# Patient Record
Sex: Female | Born: 1955
Health system: Southern US, Community
[De-identification: ages and names within clinical notes are randomized; demographics above are authoritative.]

## PROBLEM LIST (undated history)

## (undated) DIAGNOSIS — M797 Fibromyalgia: Secondary | ICD-10-CM

## (undated) DIAGNOSIS — T753XXA Motion sickness, initial encounter: Secondary | ICD-10-CM

## (undated) DIAGNOSIS — I6529 Occlusion and stenosis of unspecified carotid artery: Secondary | ICD-10-CM

## (undated) DIAGNOSIS — E039 Hypothyroidism, unspecified: Secondary | ICD-10-CM

## (undated) DIAGNOSIS — G459 Transient cerebral ischemic attack, unspecified: Secondary | ICD-10-CM

## (undated) DIAGNOSIS — C801 Malignant (primary) neoplasm, unspecified: Secondary | ICD-10-CM

## (undated) DIAGNOSIS — G43909 Migraine, unspecified, not intractable, without status migrainosus: Secondary | ICD-10-CM

## (undated) DIAGNOSIS — T8859XA Other complications of anesthesia, initial encounter: Secondary | ICD-10-CM

## (undated) HISTORY — PX: EYE SURGERY: SHX253

## (undated) HISTORY — PX: ABDOMINAL HYSTERECTOMY: SHX81

## (undated) HISTORY — PX: TONSILLECTOMY: SUR1361

## (undated) HISTORY — PX: OTHER SURGICAL HISTORY: SHX169

---

## 1991-07-03 HISTORY — PX: BREAST SURGERY: SHX581

## 1993-07-02 HISTORY — PX: BREAST BIOPSY: SHX20

## 2013-01-05 DIAGNOSIS — G43009 Migraine without aura, not intractable, without status migrainosus: Secondary | ICD-10-CM | POA: Insufficient documentation

## 2013-01-05 DIAGNOSIS — M797 Fibromyalgia: Secondary | ICD-10-CM | POA: Insufficient documentation

## 2013-01-05 DIAGNOSIS — I6522 Occlusion and stenosis of left carotid artery: Secondary | ICD-10-CM | POA: Insufficient documentation

## 2015-01-17 DIAGNOSIS — F419 Anxiety disorder, unspecified: Secondary | ICD-10-CM | POA: Insufficient documentation

## 2015-06-23 DIAGNOSIS — M810 Age-related osteoporosis without current pathological fracture: Secondary | ICD-10-CM | POA: Insufficient documentation

## 2017-02-21 LAB — HM COLONOSCOPY

## 2017-05-06 LAB — HM HIV SCREENING LAB: HM HIV Screening: NEGATIVE

## 2017-08-15 DIAGNOSIS — M1990 Unspecified osteoarthritis, unspecified site: Secondary | ICD-10-CM | POA: Insufficient documentation

## 2018-03-27 DIAGNOSIS — G43409 Hemiplegic migraine, not intractable, without status migrainosus: Secondary | ICD-10-CM | POA: Insufficient documentation

## 2018-03-28 DIAGNOSIS — E7800 Pure hypercholesterolemia, unspecified: Secondary | ICD-10-CM | POA: Insufficient documentation

## 2019-05-27 DIAGNOSIS — R748 Abnormal levels of other serum enzymes: Secondary | ICD-10-CM | POA: Insufficient documentation

## 2019-06-02 LAB — HM HEPATITIS C SCREENING LAB: HM Hepatitis Screen: NEGATIVE

## 2019-06-02 LAB — HM HIV SCREENING LAB: HM HIV Screening: NEGATIVE

## 2019-08-24 DIAGNOSIS — M542 Cervicalgia: Secondary | ICD-10-CM | POA: Diagnosis not present

## 2019-08-24 DIAGNOSIS — M6281 Muscle weakness (generalized): Secondary | ICD-10-CM | POA: Diagnosis not present

## 2019-08-24 DIAGNOSIS — M797 Fibromyalgia: Secondary | ICD-10-CM | POA: Diagnosis not present

## 2019-08-24 DIAGNOSIS — M25562 Pain in left knee: Secondary | ICD-10-CM | POA: Diagnosis not present

## 2019-08-24 DIAGNOSIS — R293 Abnormal posture: Secondary | ICD-10-CM | POA: Diagnosis not present

## 2019-08-24 DIAGNOSIS — M25561 Pain in right knee: Secondary | ICD-10-CM | POA: Diagnosis not present

## 2019-08-24 DIAGNOSIS — F17200 Nicotine dependence, unspecified, uncomplicated: Secondary | ICD-10-CM | POA: Diagnosis not present

## 2019-08-24 DIAGNOSIS — M79651 Pain in right thigh: Secondary | ICD-10-CM | POA: Diagnosis not present

## 2019-08-24 DIAGNOSIS — M545 Low back pain: Secondary | ICD-10-CM | POA: Diagnosis not present

## 2019-08-24 DIAGNOSIS — M79605 Pain in left leg: Secondary | ICD-10-CM | POA: Diagnosis not present

## 2019-08-24 DIAGNOSIS — S32009A Unspecified fracture of unspecified lumbar vertebra, initial encounter for closed fracture: Secondary | ICD-10-CM | POA: Diagnosis not present

## 2019-08-24 DIAGNOSIS — M546 Pain in thoracic spine: Secondary | ICD-10-CM | POA: Diagnosis not present

## 2019-08-31 DIAGNOSIS — R748 Abnormal levels of other serum enzymes: Secondary | ICD-10-CM | POA: Diagnosis not present

## 2019-08-31 DIAGNOSIS — Z6825 Body mass index (BMI) 25.0-25.9, adult: Secondary | ICD-10-CM | POA: Diagnosis not present

## 2019-09-21 DIAGNOSIS — K7689 Other specified diseases of liver: Secondary | ICD-10-CM | POA: Diagnosis not present

## 2019-09-21 DIAGNOSIS — R748 Abnormal levels of other serum enzymes: Secondary | ICD-10-CM | POA: Diagnosis not present

## 2019-09-21 DIAGNOSIS — R945 Abnormal results of liver function studies: Secondary | ICD-10-CM | POA: Diagnosis not present

## 2019-10-12 DIAGNOSIS — F17200 Nicotine dependence, unspecified, uncomplicated: Secondary | ICD-10-CM | POA: Diagnosis not present

## 2019-10-12 DIAGNOSIS — M545 Low back pain: Secondary | ICD-10-CM | POA: Diagnosis not present

## 2019-10-12 DIAGNOSIS — M25561 Pain in right knee: Secondary | ICD-10-CM | POA: Diagnosis not present

## 2019-10-12 DIAGNOSIS — M6281 Muscle weakness (generalized): Secondary | ICD-10-CM | POA: Diagnosis not present

## 2019-10-12 DIAGNOSIS — M79605 Pain in left leg: Secondary | ICD-10-CM | POA: Diagnosis not present

## 2019-10-12 DIAGNOSIS — M542 Cervicalgia: Secondary | ICD-10-CM | POA: Diagnosis not present

## 2019-10-12 DIAGNOSIS — R293 Abnormal posture: Secondary | ICD-10-CM | POA: Diagnosis not present

## 2019-10-12 DIAGNOSIS — M546 Pain in thoracic spine: Secondary | ICD-10-CM | POA: Diagnosis not present

## 2019-10-12 DIAGNOSIS — M797 Fibromyalgia: Secondary | ICD-10-CM | POA: Diagnosis not present

## 2019-10-12 DIAGNOSIS — M25562 Pain in left knee: Secondary | ICD-10-CM | POA: Diagnosis not present

## 2019-10-12 DIAGNOSIS — M79651 Pain in right thigh: Secondary | ICD-10-CM | POA: Diagnosis not present

## 2019-10-12 DIAGNOSIS — S32009D Unspecified fracture of unspecified lumbar vertebra, subsequent encounter for fracture with routine healing: Secondary | ICD-10-CM | POA: Diagnosis not present

## 2019-10-15 ENCOUNTER — Encounter: Payer: Self-pay | Admitting: Family Medicine

## 2019-10-15 ENCOUNTER — Other Ambulatory Visit: Payer: Self-pay

## 2019-10-15 ENCOUNTER — Ambulatory Visit (INDEPENDENT_AMBULATORY_CARE_PROVIDER_SITE_OTHER): Payer: PPO | Admitting: Family Medicine

## 2019-10-15 DIAGNOSIS — Z7689 Persons encountering health services in other specified circumstances: Secondary | ICD-10-CM | POA: Insufficient documentation

## 2019-10-15 DIAGNOSIS — G459 Transient cerebral ischemic attack, unspecified: Secondary | ICD-10-CM | POA: Insufficient documentation

## 2019-10-15 DIAGNOSIS — J302 Other seasonal allergic rhinitis: Secondary | ICD-10-CM | POA: Insufficient documentation

## 2019-10-15 NOTE — Assessment & Plan Note (Signed)
New patient establishment in clinic.  No acute concerns today.  Records to be reviewed through [facility] and will be scheduled for physical in 4 weeks for labs and preventative health screenings she is due for.

## 2019-10-15 NOTE — Patient Instructions (Signed)
I will obtain copies of your medical records and review what you are due for with your preventative health screenings.  We will plan to see you back in 1 month for your physical  You will receive a survey after today's visit either digitally by e-mail or paper by [place] mail. Your experiences and feedback matter to [place].  Please respond so we know how we are doing as we provide care for you.  Call us with any questions/concerns/needs.  It is my goal to be available to you for your health concerns.  Thanks for choosing me to be a partner in your healthcare needs!  Harlin Rain, FNP-C Family Nurse Practitioner [facility] Phone: [phone]

## 2019-10-15 NOTE — Progress Notes (Signed)
Subjective:    Patient ID: Amy Rangel, female    DOB: 03-28-1956, 64 y.o.   MRN: [id number]  Amy Rangel is a 64 y.o. female presenting on 10/15/2019 for Establish Care (pt transferring care over from Dr. Posey Pronto)   HPI  Previous PCP was at [facility].  Records will not be requested but will be reviewed through [facility].  Past medical, family, and surgical history reviewed w/ pt.  Reports she follows with [facility] for migraines, [facility] in [place] for her pain management  Denies any acute concerns today.  Depression screen PHQ 2/9 10/15/2019  Decreased Interest 0  Down, Depressed, Hopeless 0  PHQ - 2 Score 0    Social History   Tobacco Use  . Smoking status: Former Smoker    Quit date: 10/15/2018    Years since quitting: 1.0  . Smokeless tobacco: Never Used  Substance Use Topics  . Alcohol use: Not Currently  . Drug use: Not Currently    Review of Systems  Constitutional: Negative.   HENT: Negative.   Eyes: Negative.   Respiratory: Negative.   Cardiovascular: Negative.   Gastrointestinal: Negative.   Endocrine: Negative.   Genitourinary: Negative.   Musculoskeletal: Negative.   Skin: Negative.   Allergic/Immunologic: Negative.   Neurological: Negative.   Hematological: Negative.   Psychiatric/Behavioral: Negative.    Per HPI unless specifically indicated above     Objective:    BP (!) 125/49 (BP Location: Left Arm, Patient Position: Sitting, Cuff Size: Normal)   Pulse 69   Temp (!) 97.3 F (36.3 C) (Temporal)   Ht 5\' 2"  (1.575 m)   Wt 142 lb (64.4 kg)   BMI 25.97 kg/m   Wt Readings from Last 3 Encounters:  10/15/19 142 lb (64.4 kg)    Physical Exam Vitals reviewed.  Constitutional:      General: She is not in acute distress.    Appearance: Normal appearance. She is well-developed, well-groomed and overweight. She is not ill-appearing or toxic-appearing.  HENT:     Head: Normocephalic.  Eyes:     General: Lids  are normal. Vision grossly intact.        Right eye: No discharge.        Left eye: No discharge.     Extraocular Movements: Extraocular movements intact.     Conjunctiva/sclera: Conjunctivae normal.     Pupils: Pupils are equal, round, and reactive to light.  Cardiovascular:     Rate and Rhythm: Normal rate and regular rhythm.     Pulses: Normal pulses.          Dorsalis pedis pulses are 2+ on the right side and 2+ on the left side.       Posterior tibial pulses are 2+ on the right side and 2+ on the left side.     Heart sounds: Normal heart sounds. No murmur. No friction rub. No gallop.   Pulmonary:     Effort: Pulmonary effort is normal. No respiratory distress.     Breath sounds: Normal breath sounds.  Musculoskeletal:     Right lower leg: No edema.     Left lower leg: No edema.  Feet:     Right foot:     Skin integrity: Skin integrity normal.     Left foot:     Skin integrity: Skin integrity normal.  Skin:    General: Skin is warm and dry.     Capillary Refill: Capillary refill takes less than  2 seconds.  Neurological:     General: No focal deficit present.     Mental Status: She is alert and oriented to person, place, and time.     Cranial Nerves: No cranial nerve deficit.     Sensory: No sensory deficit.     Motor: No weakness.     Coordination: Coordination normal.     Gait: Gait normal.  Psychiatric:        Attention and Perception: Attention and perception normal.        Mood and Affect: Mood and affect normal.        Speech: Speech normal.        Behavior: Behavior normal. Behavior is cooperative.        Thought Content: Thought content normal.        Cognition and Memory: Cognition and memory normal.        Judgment: Judgment normal.     Results for orders placed or performed in visit on 10/15/19  HM COLONOSCOPY  Result Value Ref Range   HM Colonoscopy See Report (in chart) See Report (in chart), Patient Reported      Assessment & Plan:   Problem List  Items Addressed This Visit      Other   Encounter to establish care with new doctor    New patient establishment in clinic.  No acute concerns today.  Records to be reviewed through [facility] and will be scheduled for physical in 4 weeks for labs and preventative health screenings she is due for.         No orders of the defined types were placed in this encounter.     Follow up plan: Return in about 4 weeks (around 11/12/2019) for Physical.   Harlin Rain, [place] Nurse Practitioner [facility] 10/15/2019, 2:36 PM

## 2019-10-19 ENCOUNTER — Ambulatory Visit: Payer: Self-pay | Admitting: *Deleted

## 2019-10-19 DIAGNOSIS — M797 Fibromyalgia: Secondary | ICD-10-CM | POA: Diagnosis not present

## 2019-10-19 DIAGNOSIS — Z8711 Personal history of peptic ulcer disease: Secondary | ICD-10-CM | POA: Diagnosis not present

## 2019-10-19 DIAGNOSIS — R197 Diarrhea, unspecified: Secondary | ICD-10-CM | POA: Diagnosis not present

## 2019-10-19 DIAGNOSIS — Z91018 Allergy to other foods: Secondary | ICD-10-CM | POA: Diagnosis not present

## 2019-10-19 DIAGNOSIS — Z882 Allergy status to sulfonamides status: Secondary | ICD-10-CM | POA: Diagnosis not present

## 2019-10-19 DIAGNOSIS — Z7982 Long term (current) use of aspirin: Secondary | ICD-10-CM | POA: Diagnosis not present

## 2019-10-19 DIAGNOSIS — Z87891 Personal history of nicotine dependence: Secondary | ICD-10-CM | POA: Diagnosis not present

## 2019-10-19 DIAGNOSIS — Z880 Allergy status to penicillin: Secondary | ICD-10-CM | POA: Diagnosis not present

## 2019-10-19 DIAGNOSIS — R1084 Generalized abdominal pain: Secondary | ICD-10-CM | POA: Diagnosis not present

## 2019-10-19 DIAGNOSIS — Z91041 Radiographic dye allergy status: Secondary | ICD-10-CM | POA: Diagnosis not present

## 2019-10-19 DIAGNOSIS — Z885 Allergy status to narcotic agent status: Secondary | ICD-10-CM | POA: Diagnosis not present

## 2019-10-19 DIAGNOSIS — R109 Unspecified abdominal pain: Secondary | ICD-10-CM | POA: Diagnosis not present

## 2019-10-19 DIAGNOSIS — M549 Dorsalgia, unspecified: Secondary | ICD-10-CM | POA: Diagnosis not present

## 2019-10-19 NOTE — Telephone Encounter (Signed)
Sudden onset abdominal pain with diarrhea and history of bleeding ulcers with worsening over 3 days, I would recommend ER evaluation.  Our labs will take at least 1 day to result and if the concern is ulceration, it could need emergent imaging/evaluation, especially since it is not getting better.

## 2019-10-19 NOTE — Telephone Encounter (Signed)
Pt called with complaints of abdominal pain with diarrhea since 10/16/19; the pt states she took pepto on 4/16 & 4/17; she had black stool, diarrhea, but it did slow down her diarrhea; she says today her stool has started returning to it's normal color 10/1919 she had 2 pieced of toast and a bowled egg and the diarrhea resumed; she has been having 3 episodes daily; the pt says she has IBS; she has right sided pain rated 4 - 5; her pain is constant, but worsens when she eats; the pt says that this is different from IBS pain; she also says that she has a history of "bleeding ulcers"; recommendations made per nurse triage protocol; she verbalized understanding; the pt sees Berdine Addison, [employer]; will route to office for notification.  Reason for Disposition . Black or tarry bowel movements  (Exception: chronic-unchanged  black-grey bowel movements AND is taking iron pills or Pepto-bismol)  Answer Assessment - Initial Assessment Questions 1. LOCATION: "Where does it hurt?"      Right side from waist to rib cage 2. RADIATION: "Does the pain shoot anywhere else?" (e.g., chest, back)     Into back 3. ONSET: "When did the pain begin?" (e.g., minutes, hours or days ago)     10/16/19 4. SUDDEN: "Gradual or sudden onset?"    suddenly 5. PATTERN "Does the pain come and go, or is it constant?"    - If constant: "Is it getting better, staying the same, or worsening?"      (Note: Constant means the pain never goes away completely; most serious pain is constant and it progresses)     - If intermittent: "How long does it last?" "Do you have pain now?"     (Note: Intermittent means the pain goes away completely between bouts)    constant 6. SEVERITY: "How bad is the pain?"  (e.g., Scale 1-10; mild, moderate, or severe)   - MILD (1-3): doesn't interfere with normal activities, abdomen soft and not tender to touch    - MODERATE (4-7): interferes with normal activities or awakens from sleep, tender to  touch    - SEVERE (8-10): excruciating pain, doubled over, unable to do any normal activities     4-5 out of 10 7. RECURRENT SYMPTOM: "Have you ever had this type of abdominal pain before?" If so, ask: "When was the last time?" and "What happened that time?"     Yes, could not find anythin 8. CAUSE: "What do you think is causing the abdominal pain?"     Not sure 9. RELIEVING/AGGRAVATING FACTORS: "What makes it better or worse?" (e.g., movement, antacids, bowel movement)     Eating makes pain worse 10. OTHER SYMPTOMS: "Has there been any vomiting, diarrhea, constipation, or urine problems?"     diarrhea 11. PREGNANCY: "Is there any chance you are pregnant?" "When was your last menstrual period?"       No, hysterectomy  Protocols used: ABDOMINAL PAIN - [facility]

## 2019-10-19 NOTE — Telephone Encounter (Signed)
The pt was already recommended to go to the  ER by the traige nurse Sheran Spine, RN this morning.

## 2019-10-22 ENCOUNTER — Ambulatory Visit (INDEPENDENT_AMBULATORY_CARE_PROVIDER_SITE_OTHER): Payer: PPO | Admitting: Family Medicine

## 2019-10-22 ENCOUNTER — Encounter: Payer: Self-pay | Admitting: Family Medicine

## 2019-10-22 ENCOUNTER — Other Ambulatory Visit: Payer: Self-pay

## 2019-10-22 VITALS — BP 108/48 | HR 73 | Temp 97.1°F | Ht 62.0 in | Wt 141.4 lb

## 2019-10-22 DIAGNOSIS — F419 Anxiety disorder, unspecified: Secondary | ICD-10-CM | POA: Diagnosis not present

## 2019-10-22 DIAGNOSIS — R1084 Generalized abdominal pain: Secondary | ICD-10-CM | POA: Diagnosis not present

## 2019-10-22 DIAGNOSIS — R109 Unspecified abdominal pain: Secondary | ICD-10-CM | POA: Insufficient documentation

## 2019-10-22 DIAGNOSIS — R748 Abnormal levels of other serum enzymes: Secondary | ICD-10-CM

## 2019-10-22 DIAGNOSIS — Z1231 Encounter for screening mammogram for malignant neoplasm of breast: Secondary | ICD-10-CM

## 2019-10-22 DIAGNOSIS — E7800 Pure hypercholesterolemia, unspecified: Secondary | ICD-10-CM

## 2019-10-22 DIAGNOSIS — Z0000 Encounter for general adult medical examination without abnormal findings: Secondary | ICD-10-CM

## 2019-10-22 DIAGNOSIS — R911 Solitary pulmonary nodule: Secondary | ICD-10-CM | POA: Insufficient documentation

## 2019-10-22 DIAGNOSIS — Z13820 Encounter for screening for osteoporosis: Secondary | ICD-10-CM | POA: Diagnosis not present

## 2019-10-22 DIAGNOSIS — M2550 Pain in unspecified joint: Secondary | ICD-10-CM | POA: Diagnosis not present

## 2019-10-22 LAB — POCT URINALYSIS DIPSTICK
Bilirubin, UA: NEGATIVE
Blood, UA: NEGATIVE
Glucose, UA: NEGATIVE
Ketones, UA: NEGATIVE
Leukocytes, UA: NEGATIVE
Nitrite, UA: NEGATIVE
Protein, UA: NEGATIVE
Spec Grav, UA: 1.025 (ref 1.010–1.025)
Urobilinogen, UA: 0.2 E.U./dL
pH, UA: 5 (ref 5.0–8.0)

## 2019-10-22 MED ORDER — CLONAZEPAM 0.5 MG PO TABS: 0.2500 mg | ORAL_TABLET | Freq: Every day | ORAL | 0 refills | Status: DC

## 2019-10-22 NOTE — Progress Notes (Signed)
Subjective:    Patient ID: Amy Rangel, female    DOB: 05-Mar-1956, 64 y.o.   MRN: [id number]  Amy Rangel is a 64 y.o. female presenting on 10/22/2019 for Abdominal Pain (Pt seen in the ER for abdominla pain located throughout the right side of the abdomen, with some radiation to the R back. x 3 days ago. Pt state the pain have improved and basically subsided.) and Anxiety (pt requesting a refill of her clonazepam)   HPI  Amy Rangel presents to clinic for a follow up on her abdominal pain from last week.  She was seen in the ER with [facility] on 10/19/2019 and had a finding of an incidental right lower lobe pulmonary nodule measuring 3.44m.  Reports abdominal pain has resolved since the ER visit.  Requesting refill on her clonazepam for her anxiety.  Depression screen PHQ 2/9 10/15/2019  Decreased Interest 0  Down, Depressed, Hopeless 0  PHQ - 2 Score 0    Social History   Tobacco Use  . Smoking status: Former Smoker    Quit date: 10/15/2018    Years since quitting: 1.0  . Smokeless tobacco: Never Used  Substance Use Topics  . Alcohol use: Not Currently  . Drug use: Not Currently    Review of Systems  Constitutional: Negative.   HENT: Negative.   Eyes: Negative.   Respiratory: Negative.   Cardiovascular: Negative.   Gastrointestinal: Negative.   Endocrine: Negative.   Genitourinary: Negative.   Musculoskeletal: Negative.   Skin: Negative.   Allergic/Immunologic: Negative.   Neurological: Negative.   Hematological: Negative.   Psychiatric/Behavioral: Negative.    Per HPI unless specifically indicated above     Objective:    BP (!) 108/48 (BP Location: Right Arm, Patient Position: Sitting, Cuff Size: Normal)   Pulse 73   Temp (!) 97.1 F (36.2 C) (Temporal)   Ht _0  (1.575 m)   Wt 141 lb 6.4 oz (64.1 kg)   BMI 25.86 kg/m   Wt Readings from Last 3 Encounters:  10/22/19 141 lb 6.4 oz (64.1 kg)  10/15/19 142 lb (64.4 kg)    Physical Exam Vitals reviewed.    Constitutional:      General: She is not in acute distress.    Appearance: Normal appearance. She is obese. She is not ill-appearing or toxic-appearing.  HENT:     Head: Normocephalic.  Eyes:     General: Lids are normal. Vision grossly intact.        Right eye: No discharge.        Left eye: No discharge.     Extraocular Movements: Extraocular movements intact.     Conjunctiva/sclera: Conjunctivae normal.     Pupils: Pupils are equal, round, and reactive to light.  Cardiovascular:     Rate and Rhythm: Normal rate and regular rhythm.     Pulses: Normal pulses.          Dorsalis pedis pulses are 2+ on the right side and 2+ on the left side.       Posterior tibial pulses are 2+ on the right side and 2+ on the left side.     Heart sounds: Normal heart sounds. No murmur. No friction rub. No gallop.   Pulmonary:     Effort: Pulmonary effort is normal. No respiratory distress.     Breath sounds: Normal breath sounds.  Musculoskeletal:     Right lower leg: No edema.     Left lower leg: No edema.  Feet:     Right foot:     Skin integrity: Skin integrity normal.     Left foot:     Skin integrity: Skin integrity normal.  Skin:    General: Skin is warm and dry.     Capillary Refill: Capillary refill takes less than 2 seconds.  Neurological:     General: No focal deficit present.     Mental Status: She is alert and oriented to person, place, and time.     Cranial Nerves: No cranial nerve deficit.     Sensory: No sensory deficit.     Motor: No weakness.     Coordination: Coordination normal.     Gait: Gait normal.  Psychiatric:        Attention and Perception: Attention and perception normal.        Mood and Affect: Mood and affect normal.        Speech: Speech normal.        Behavior: Behavior normal. Behavior is cooperative.        Thought Content: Thought content normal.        Cognition and Memory: Cognition and memory normal.        Judgment: Judgment normal.     Results  for orders placed or performed in visit on 10/22/19  POCT Urinalysis Dipstick  Result Value Ref Range   Color, UA Yellow    Clarity, UA clear    Glucose, UA Negative Negative   Bilirubin, UA negative    Ketones, UA negative    Spec Grav, UA 1.025 1.010 - 1.025   Blood, UA negative    pH, UA 5.0 5.0 - 8.0   Protein, UA Negative Negative   Urobilinogen, UA 0.2 0.2 or 1.0 E.U./dL   Nitrite, UA Negative    Leukocytes, UA Negative Negative   Appearance     Odor        Assessment & Plan:   Problem List Items Addressed This Visit      Other   Anxiety - Primary    Currently stable and well controlled with prescription of clonazepam 0.34m twice daily PRN.  [place]ally reviewed [place], 10/22/19.  According to PMP aware, pt last received a refill on 04/06/2020.  Refill of her controlled substance will be provided today.  Plan: 1. Refill sent 2. Follow up in 3 months for re-evaluation       Relevant Medications   clonazePAM (KLONOPIN) 0.5 MG tablet   Elevated alkaline phosphatase level   Relevant Orders   Gamma GT   Hypercholesterolemia   Relevant Orders   Lipid Profile   Abdominal pain    Met with [facility] ER on 10/19/2019, negative evaluation.  Abdominal pain has resolved since.  Reports history of following with a GI provider many years ago.  Offered and declined referral to re-establish.      Relevant Orders   POCT Urinalysis Dipstick (Completed)   Right lower lobe pulmonary nodule    Incidental finding on 10/19/2019 when went to the ER with abdominal pain of right lower lobe pulmonary nodule at 3.342m    Plan: 1. Will contact ShBurgess EstelleRN coordinator of LDCT scheduling and pulmonary nodule clinic for referral/coordination.       Other Visit Diagnoses    Routine medical exam       Relevant Orders   CBC with Differential   COMPLETE METABOLIC PANEL WITH GFR   Lipid Profile   Thyroid Panel With TSH   Encounter for  screening mammogram for malignant neoplasm of  breast       Relevant Orders   MM 3D SCREEN BREAST BILATERAL   Screening for osteoporosis       Relevant Orders   DG Bone Density      Meds ordered this encounter  Medications  . clonazePAM (KLONOPIN) 0.5 MG tablet    Sig: Take 0.5 tablets (0.25 mg total) by mouth daily.    Dispense:  90 tablet    Refill:  0      Follow up plan: Return in about 3 months (around 01/21/2020) for Physical.   Harlin Rain, [place] Nurse Practitioner [facility] 10/22/2019, 3:55 PM

## 2019-10-22 NOTE — Assessment & Plan Note (Signed)
Incidental finding on 10/19/2019 when went to the ER with abdominal pain of right lower lobe pulmonary nodule at 3.42mm.    Plan: 1. Will contact Burgess Estelle, RN coordinator of LDCT scheduling and pulmonary nodule clinic for referral/coordination.

## 2019-10-22 NOTE — Assessment & Plan Note (Signed)
Met with [facility] ER on 10/19/2019, negative evaluation.  Abdominal pain has resolved since.  Reports history of following with a GI provider many years ago.  Offered and declined referral to re-establish.

## 2019-10-22 NOTE — Patient Instructions (Addendum)
I have sent in a refill of your klonopin to your pharmacy on file.  I have reached out to the program coordinator for the pulmonary nodule clinic to have you scheduled for follow up and additional imaging of your incidental pulmonary nodule finding from 10/19/2019.  As we discussed, have your labs drawn in the next 1-2 weeks and we will contact you with the results.  For Mammogram screening for breast cancer and DEXA Scan (Bone mineral density) screening for osteoporosis  Call the [facility] below anytime to schedule your own appointment now that order has been placed.  [facility] [place], [place] [address] Phone: [phone]  [facility] [address] [place], [place] [address] Phone: [phone]   The following recommendations are helpful adjuncts for helping rebalance your mood.  Eat a nourishing diet. Ensure adequate intake of calories, protein, carbs, fat, vitamins, and minerals. Prioritize whole foods at each meal, including meats, vegetables, fruits, nuts and seeds, etc.   Avoid inflammatory and/or "junk" foods, such as sugar, omega-6 fats, refined grains, chemicals, and preservatives are common in packaged and prepared foods. Minimize or completely avoid these ingredients and stick to whole foods with little to no additives. Cook from scratch as much as possible for more control over what you eat  Get enough sleep. Poor sleep is significantly associated with depression and anxiety. Make 7-9 hours of sleep nightly a top priority  Exercise appropriately. Exercise is known to improve brain functioning and boost mood. Aim for 30 minutes of daily physical activity. Avoid "overtraining," which can cause mental disturbances  Assess your light exposure. Not enough natural light during the day and too much artificial light can have a major impact on your mood. Get outside as often as possible  during daylight hours. Minimize light exposure after dark and avoid the use of electronics that give off blue light before bed  Manage your stress.  Use daily stress management techniques such as meditation, yoga, or mindfulness to retrain your brain to respond differently to stress. Try deep breathing to deactivate your "fight or flight" response.  There are many of sources with apps like Headspace, Calm or a variety of YouTube videos (videos from Gwynne Edinger have guided meditation)  Prioritize your social life. Work on building social support with new friends or improve current relationships. Consider getting a pet that allows for companionship, social interaction, and physical touch. Try volunteering or joining a faith-based community to increase your sense of purpose  4-7-8 breathing technique at bedtime: breathe in to count of 4, hold breath for count of 7, exhale for count of 8; do 3-5 times for letting go of overactive thoughts  Take time to play Unstructured "play" time can help reduce anxiety and depression Options for play include music, games, sports, dance, art, etc.  Try to add daily omega 3 fatty acids, magnesium, B complex, and balanced amino acid supplements to help improve mood and anxiety.    We will plan to see you back in 3 months for physical and follow up on anxiety  You will receive a survey after today's visit either digitally by e-mail or paper by [employer]. Your experiences and feedback matter to [place].  Please respond so we know how we are doing as we provide care for you.  Call us with any questions/concerns/needs.  It is my goal to be available to you for your health concerns.  Thanks for choosing me  to be a partner in your healthcare needs!  Harlin Rain, FNP-C Family Nurse Practitioner [facility] Phone: [phone]

## 2019-10-22 NOTE — Assessment & Plan Note (Signed)
Currently stable and well controlled with prescription of clonazepam 0.25mg  twice daily PRN.  Personally reviewed [place] today, 10/22/19.  According to PMP aware, pt last received a refill on 04/06/2020.  Refill of her controlled substance will be provided today.  Plan: 1. Refill sent 2. Follow up in 3 months for re-evaluation

## 2019-11-02 ENCOUNTER — Ambulatory Visit
Admission: RE | Admit: 2019-11-02 | Discharge: 2019-11-02 | Disposition: A | Discharge status: Home / Self Care | Payer: PPO | Source: Ambulatory Visit | Attending: Family Medicine | Admitting: Family Medicine

## 2019-11-02 ENCOUNTER — Other Ambulatory Visit: Payer: Self-pay

## 2019-11-02 DIAGNOSIS — Z1231 Encounter for screening mammogram for malignant neoplasm of breast: Secondary | ICD-10-CM | POA: Diagnosis not present

## 2019-11-02 DIAGNOSIS — M818 Other osteoporosis without current pathological fracture: Secondary | ICD-10-CM | POA: Diagnosis not present

## 2019-11-02 DIAGNOSIS — Z13820 Encounter for screening for osteoporosis: Secondary | ICD-10-CM | POA: Diagnosis not present

## 2019-11-02 DIAGNOSIS — M810 Age-related osteoporosis without current pathological fracture: Secondary | ICD-10-CM | POA: Diagnosis not present

## 2019-11-02 HISTORY — DX: Malignant (primary) neoplasm, unspecified: C80.1

## 2019-11-05 ENCOUNTER — Encounter: Payer: PPO | Admitting: Family Medicine

## 2019-11-06 ENCOUNTER — Inpatient Hospital Stay
Admission: RE | Admit: 2019-11-06 | Discharge: 2019-11-06 | Disposition: A | Discharge status: Home / Self Care | Payer: Self-pay | Source: Ambulatory Visit | Attending: *Deleted | Admitting: *Deleted

## 2019-11-06 ENCOUNTER — Other Ambulatory Visit: Payer: Self-pay | Admitting: Family Medicine

## 2019-11-06 ENCOUNTER — Other Ambulatory Visit: Payer: Self-pay | Admitting: *Deleted

## 2019-11-06 DIAGNOSIS — Z1231 Encounter for screening mammogram for malignant neoplasm of breast: Secondary | ICD-10-CM

## 2019-11-09 ENCOUNTER — Other Ambulatory Visit: Payer: Self-pay | Admitting: Family Medicine

## 2019-11-09 DIAGNOSIS — R928 Other abnormal and inconclusive findings on diagnostic imaging of breast: Secondary | ICD-10-CM

## 2019-11-09 DIAGNOSIS — R921 Mammographic calcification found on diagnostic imaging of breast: Secondary | ICD-10-CM

## 2019-11-10 ENCOUNTER — Telehealth: Payer: Self-pay

## 2019-11-10 NOTE — Telephone Encounter (Signed)
Copied from [place] [id number]. Topic: General - Inquiry >> Nov 09, 2019  2:49 PM Richardo Priest, NT wrote: Reason for CRM: Patient called in stating she is returning a call from PCP in regards to mammogram. Please advise.  The pt was notified about her lab results by [facility].

## 2019-11-13 ENCOUNTER — Ambulatory Visit
Admission: RE | Admit: 2019-11-13 | Discharge: 2019-11-13 | Disposition: A | Discharge status: Home / Self Care | Payer: PPO | Source: Ambulatory Visit | Attending: Family Medicine | Admitting: Family Medicine

## 2019-11-13 DIAGNOSIS — R921 Mammographic calcification found on diagnostic imaging of breast: Secondary | ICD-10-CM

## 2019-11-13 DIAGNOSIS — N6489 Other specified disorders of breast: Secondary | ICD-10-CM | POA: Diagnosis not present

## 2019-11-13 DIAGNOSIS — R928 Other abnormal and inconclusive findings on diagnostic imaging of breast: Secondary | ICD-10-CM

## 2019-11-17 ENCOUNTER — Other Ambulatory Visit: Payer: Self-pay

## 2019-11-17 ENCOUNTER — Encounter: Payer: Self-pay | Admitting: Family Medicine

## 2019-11-17 ENCOUNTER — Ambulatory Visit (INDEPENDENT_AMBULATORY_CARE_PROVIDER_SITE_OTHER): Payer: PPO | Admitting: Family Medicine

## 2019-11-17 VITALS — BP 118/51 | HR 64 | Temp 97.1°F | Resp 18 | Ht 62.0 in | Wt 141.4 lb

## 2019-11-17 DIAGNOSIS — Z0000 Encounter for general adult medical examination without abnormal findings: Secondary | ICD-10-CM | POA: Diagnosis not present

## 2019-11-17 DIAGNOSIS — R599 Enlarged lymph nodes, unspecified: Secondary | ICD-10-CM | POA: Insufficient documentation

## 2019-11-17 DIAGNOSIS — E7800 Pure hypercholesterolemia, unspecified: Secondary | ICD-10-CM | POA: Diagnosis not present

## 2019-11-17 DIAGNOSIS — R748 Abnormal levels of other serum enzymes: Secondary | ICD-10-CM | POA: Diagnosis not present

## 2019-11-17 NOTE — Patient Instructions (Signed)
Have your labs completed today and we will contact you with the results.  I am putting in a follow up request to the pulmonary nodule clinic for re-evaluation of the pulmonary nodule noted from a previous ER visit.  I will contact [place] for additional information regarding COVID vaccine exemptions and provide you with an update.  I have ordered a right subclavian ultrasound for the right lymph node that has been present for > 10 years with recent changes in size.  If you do not hear from them in 1 week to please contact our office and I will follow up with them.  Well Visit, Ages 73 to 12: Care Instructions Overview  Well visits can help you stay healthy. Your provider has checked your overall health and may have suggested ways to take good care of yourself. Your provider also may have recommended tests. At home, you can help prevent illness with healthy eating, regular exercise, and other steps.  Follow-up care is a key part of your treatment and safety. Be sure to make and go to all appointments, and call your provider if you are having problems. It's also a good idea to know your test results and keep a list of the medicines you take.  How can you care for yourself at home?   Get screening tests that you and your doctor decide on. Screening helps find diseases before any symptoms appear.   Eat healthy foods. Choose fruits, vegetables, whole grains, protein, and low-fat dairy foods. Limit fat, especially saturated fat. Reduce salt in your diet.   Limit alcohol. If you are a man, have no more than 2 drinks a day or 14 drinks a week. If you are a woman, have no more than 1 drink a day or 7 drinks a week.   Get at least 30 minutes of physical activity on most days of the week.  We recommend you go no more than 2 days in a row without exercise. Walking is a good choice. You also may want to do other activities, such as running, swimming, cycling, or playing tennis or team sports.  Discuss any changes in your exercise program with your provider.   Reach and stay at a healthy weight. This will lower your risk for many problems, such as obesity, diabetes, heart disease, and high blood pressure.   Do not smoke or allow others to smoke around you. If you need help quitting, talk to your provider about stop-smoking programs and medicines. These can increase your chances of quitting for good.  Can call 1-800-QUIT-NOW 419-619-5626) for the [facility], assistance with smoking cessation.   Care for your mental health. It is easy to get weighed down by worry and stress. Learn strategies to manage stress, like deep breathing and mindfulness, and stay connected with your family and community. If you find you often feel sad or hopeless, talk with your provider. Treatment can help.   Talk to your provider about whether you have any risk factors for sexually transmitted infections (STIs). You can help prevent STIs if you wait to have sex with a new partner (or partners) until you've each been tested for STIs. It also helps if you use condoms (female or female condoms) and if you limit your sex partners to one person who only has sex with you. Vaccines are available for some STIs, such as HPV (these are age dependent).   Use birth control if it's important to you to prevent pregnancy. Talk with your  provider about the choices available and what might be best for you.   If you think you may have a problem with alcohol or drug use, talk to your provider. This includes prescription medicines (such as amphetamines and opioids) and illegal drugs (such as cocaine and methamphetamine). Your provider can help you figure out what type of treatment is best for you.   If you have concerns about domestic violence or intimate partner violence, there are resources available to you. National Domestic Abuse Hotline 2527219508   Protect your skin from too much sun. When you're outdoors  from 10 a.m. to 4 p.m., stay in the shade or cover up with clothing and a hat with a wide brim. Wear sunglasses that block UV rays. Even when it's cloudy, put broad-spectrum sunscreen (SPF 30 or higher) on any exposed skin.   See a dentist one or two times a year for checkups and to have your teeth cleaned.   See an eye doctor once per year for an eye exam.   Wear a seat belt in the car.  When should you call for help?  Watch closely for changes in your health, and be sure to contact your provider if you have any problems or symptoms that concern you.  We will plan to see you back in 6 months for anxiety follow up  You will receive a survey after today's visit either digitally by e-mail or paper by [place] mail. Your experiences and feedback matter to [place].  Please respond so we know how we are doing as we provide care for you.  Call us with any questions/concerns/needs.  It is my goal to be available to you for your health concerns.  Thanks for choosing me to be a partner in your healthcare needs!  Harlin Rain, FNP-C Family Nurse Practitioner [facility] Phone: [phone]

## 2019-11-17 NOTE — Assessment & Plan Note (Signed)
Patient reports right subclavian lymph node x 10 years with recent enlargement.  Reports having had an ultrasound here many years ago but is concerned since has recently changed in size.  Plan: 1. Neck ultrasound ordered for evaluation of right subclavian lymph node enlargement

## 2019-11-17 NOTE — Progress Notes (Signed)
Subjective:    Patient ID: Amy Rangel, female    DOB: Oct 15, 1955, 64 y.o.   MRN: [id number]  Amy Rangel is a 64 y.o. female presenting on 11/17/2019 for Annual Exam   HPI   Amy Rangel presents to clinic for annual exam today without any acute concerns.  HEALTH MAINTENANCE:  Weight/BMI: Stable, 25.86 BMI Physical activity: No structured exercise routine Diet: Regular Seatbelt: Always Sunscreen: Sometimes Mammogram: Completed 11/13/2019 DEXA: Completed 11/02/2019 Colon cancer screening: Completed 02/21/2017 HIV & Hep C Screening: Completed with [facility], records reviewed in [place]: Regularly Dentistry: Regularly  IMMUNIZATIONS: Influenza: Due next season Tetanus: Completed 01/12/2016 COVID: Discussed, concerned about allergies  Depression screen PHQ 2/9 10/15/2019  Decreased Interest 0  Down, Depressed, Hopeless 0  PHQ - 2 Score 0    Past Medical History:  Diagnosis Date  . Cancer ([place])    skin ca   Past Surgical History:  Procedure Laterality Date  . ABDOMINAL HYSTERECTOMY    . BREAST BIOPSY Left 1995   neg  . BREAST SURGERY Left 1993   Left lumpectomy/partial mastectomy  . foot surgery Right    Right foot ganglion cyst removal  . TONSILLECTOMY     Social History   Socioeconomic History  . Marital status: Divorced    Spouse name: Not on file  . Number of children: Not on file  . Years of education: Not on file  . Highest education level: Not on file  Occupational History  . Not on file  Tobacco Use  . Smoking status: Former Smoker    Quit date: 10/15/2018    Years since quitting: 1.0  . Smokeless tobacco: Never Used  Substance and Sexual Activity  . Alcohol use: Not Currently  . Drug use: Not Currently  . Sexual activity: Not on file  Other Topics Concern  . Not on file  Social History Narrative  . Not on file   Social Determinants of Health   Financial Resource Strain:   . Difficulty of Paying Living Expenses:   Food  Insecurity:   . Worried About Charity fundraiser in the Last Year:   . Arboriculturist in the Last Year:   Transportation Needs:   . Film/video editor (Medical):   Marland Kitchen Lack of Transportation (Non-Medical):   Physical Activity:   . Days of Exercise per Week:   . Minutes of Exercise per Session:   Stress:   . Feeling of Stress :   Social Connections:   . Frequency of Communication with Friends and Family:   . Frequency of Social Gatherings with Friends and Family:   . Attends Religious Services:   . Active Member of Clubs or Organizations:   . Attends Archivist Meetings:   Marland Kitchen Marital Status:   Intimate Partner Violence:   . Fear of Current or Ex-Partner:   . Emotionally Abused:   Marland Kitchen Physically Abused:   . Sexually Abused:    Family History  Problem Relation Age of Onset  . Hypothyroidism Mother   . Breast cancer Mother 71  . Leukemia Father    Current Outpatient Medications on File Prior to Visit  Medication Sig  . baclofen (LIORESAL) 10 MG tablet Take 10 mg by mouth 2 (two) times daily.  . butalbital-acetaminophen-caffeine (FIORICET) 50-325-40 MG tablet Take by mouth.  . Cholecalciferol 50 MCG (2000 UT) TABS Take by mouth.  . clonazePAM (KLONOPIN) 0.5 MG tablet Take 0.5 tablets (0.25 mg total) by  mouth daily.  Marland Kitchen GNP GARLIC EXTRACT PO Take by mouth.  . Magnesium Gluconate 550 MG TABS Take by mouth.  . Omega 3 1000 MG CAPS Take by mouth.  . Riboflavin (B-2-400 PO) Take by mouth.  . traMADol (ULTRAM) 50 MG tablet Take 50 mg by mouth daily as needed.   . Turmeric Curcumin 500 MG CAPS Take by mouth.  . Zinc Sulfate 66 MG TABS Take by mouth.   No current facility-administered medications on file prior to visit.    Per HPI unless specifically indicated above     Objective:    BP (!) 118/51 (BP Location: Left Arm, Patient Position: Sitting, Cuff Size: Normal)   Pulse 64   Temp (!) 97.1 F (36.2 C) (Temporal)   Resp 18   Ht 5\' 2"  (1.575 m)   Wt 141 lb 6.4  oz (64.1 kg)   SpO2 100%   BMI 25.86 kg/m   Wt Readings from Last 3 Encounters:  11/17/19 141 lb 6.4 oz (64.1 kg)  10/22/19 141 lb 6.4 oz (64.1 kg)  10/15/19 142 lb (64.4 kg)   Physical Exam Vitals reviewed.  Constitutional:      General: She is not in acute distress.    Appearance: Normal appearance. She is well-developed, well-groomed and overweight. She is not ill-appearing or toxic-appearing.  HENT:     Head: Normocephalic.     Right Ear: Tympanic membrane, ear canal and external ear normal. There is no impacted cerumen.     Left Ear: Tympanic membrane, ear canal and external ear normal. There is no impacted cerumen.     Nose: Nose normal. No congestion or rhinorrhea.     Mouth/Throat:     Lips: Pink.     Mouth: Mucous membranes are moist.     Pharynx: Oropharynx is clear. Uvula midline. No oropharyngeal exudate or posterior oropharyngeal erythema.  Eyes:     General: Lids are normal. Vision grossly intact. No scleral icterus.       Right eye: No discharge.        Left eye: No discharge.     Extraocular Movements: Extraocular movements intact.     Conjunctiva/sclera: Conjunctivae normal.     Pupils: Pupils are equal, round, and reactive to light.  Neck:     Thyroid: No thyroid mass or thyromegaly.   Cardiovascular:     Rate and Rhythm: Normal rate and regular rhythm.     Pulses: Normal pulses.          Dorsalis pedis pulses are 2+ on the right side and 2+ on the left side.     Heart sounds: Normal heart sounds. No murmur. No friction rub. No gallop.   Pulmonary:     Effort: Pulmonary effort is normal. No respiratory distress.     Breath sounds: Normal breath sounds.  Abdominal:     General: Abdomen is flat. Bowel sounds are normal. There is no distension.     Palpations: Abdomen is soft. There is no hepatomegaly, splenomegaly or mass.     Tenderness: There is no abdominal tenderness. There is no guarding or rebound.     Hernia: No hernia is present.  Musculoskeletal:         General: Normal range of motion.     Cervical back: Normal range of motion and neck supple. No tenderness.     Right lower leg: No edema.     Left lower leg: No edema.  Feet:     Right foot:  Skin integrity: Skin integrity normal.     Left foot:     Skin integrity: Skin integrity normal.  Lymphadenopathy:     Cervical: Cervical adenopathy present.     Right cervical: No deep or posterior cervical adenopathy.    Left cervical: No deep or posterior cervical adenopathy.  Skin:    General: Skin is warm and dry.     Capillary Refill: Capillary refill takes less than 2 seconds.  Neurological:     General: No focal deficit present.     Mental Status: She is alert and oriented to person, place, and time.     Cranial Nerves: No cranial nerve deficit.     Sensory: No sensory deficit.     Motor: No weakness.     Coordination: Coordination normal.     Gait: Gait normal.     Deep Tendon Reflexes: Reflexes normal.  Psychiatric:        Attention and Perception: Attention and perception normal.        Mood and Affect: Mood and affect normal.        Speech: Speech normal.        Behavior: Behavior normal. Behavior is cooperative.        Thought Content: Thought content normal.        Cognition and Memory: Cognition and memory normal.        Judgment: Judgment normal.     Results for orders placed or performed in visit on 11/17/19  HM HIV SCREENING LAB  Result Value Ref Range   HM HIV Screening Negative - Validated   HM HIV SCREENING LAB  Result Value Ref Range   HM HIV Screening Negative - Validated   HM HEPATITIS C SCREENING LAB  Result Value Ref Range   HM Hepatitis Screen Negative-Validated       Assessment & Plan:   Problem List Items Addressed This Visit      Immune and Lymphatic   Lymph node enlargement - Primary    Patient reports right subclavian lymph node x 10 years with recent enlargement.  Reports having had an ultrasound here many years ago but is concerned  since has recently changed in size.  Plan: 1. Neck ultrasound ordered for evaluation of right subclavian lymph node enlargement      Relevant Orders   US Soft Tissue Head/Neck (NON-THYROID)     Other   Routine medical exam    Annual physical exam with new findings, see additional A/P.  Well adult with no acute concerns.  Plan: 1. Obtain health maintenance screenings as above according to age. - Increase physical activity to 30 minutes most days of the week.  - Eat healthy diet high in vegetables and fruits; low in refined carbohydrates. - Screening labs and tests as ordered 2. Return 1 year for annual physical.          No orders of the defined types were placed in this encounter.     Follow up plan: Return in about 6 months (around 05/19/2020) for Anxiety follow up.  Harlin Rain, FNP-C Family Nurse Practitioner [facility] 11/17/2019, 10:16 AM

## 2019-11-18 ENCOUNTER — Other Ambulatory Visit: Payer: Self-pay | Admitting: Family Medicine

## 2019-11-18 ENCOUNTER — Encounter: Payer: Self-pay | Admitting: Family Medicine

## 2019-11-18 DIAGNOSIS — R7989 Other specified abnormal findings of blood chemistry: Secondary | ICD-10-CM

## 2019-11-18 LAB — CBC WITH DIFFERENTIAL/PLATELET
Absolute Monocytes: 352 cells/uL (ref 200–950)
Basophils Absolute: 13 cells/uL (ref 0–200)
Basophils Relative: 0.2 %
Eosinophils Absolute: 58 cells/uL (ref 15–500)
Eosinophils Relative: 0.9 %
HCT: 40.3 % (ref 35.0–45.0)
Hemoglobin: 13.4 g/dL (ref 11.7–15.5)
Lymphs Abs: 2656 cells/uL (ref 850–3900)
MCH: 29.6 pg (ref 27.0–33.0)
MCHC: 33.3 g/dL (ref 32.0–36.0)
MCV: 89.2 fL (ref 80.0–100.0)
MPV: 10.7 fL (ref 7.5–12.5)
Monocytes Relative: 5.5 %
Neutro Abs: 3322 cells/uL (ref 1500–7800)
Neutrophils Relative %: 51.9 %
Platelets: 250 10*3/uL (ref 140–400)
RBC: 4.52 10*6/uL (ref 3.80–5.10)
RDW: 12.6 % (ref 11.0–15.0)
Total Lymphocyte: 41.5 %
WBC: 6.4 10*3/uL (ref 3.8–10.8)

## 2019-11-18 LAB — COMPLETE METABOLIC PANEL WITH GFR
AG Ratio: 1.8 (calc) (ref 1.0–2.5)
ALT: 15 U/L (ref 6–29)
AST: 15 U/L (ref 10–35)
Albumin: 4.3 g/dL (ref 3.6–5.1)
Alkaline phosphatase (APISO): 142 U/L (ref 37–153)
BUN: 13 mg/dL (ref 7–25)
CO2: 30 mmol/L (ref 20–32)
Calcium: 9.6 mg/dL (ref 8.6–10.4)
Chloride: 105 mmol/L (ref 98–110)
Creat: 0.7 mg/dL (ref 0.50–0.99)
GFR, Est African American: 105 mL/min/{1.73_m2} (ref >=60)
GFR, Est Non African American: 91 mL/min/{1.73_m2} (ref >=60)
Globulin: 2.4 g/dL (calc) (ref 1.9–3.7)
Glucose, Bld: 82 mg/dL (ref 65–99)
Potassium: 3.9 mmol/L (ref 3.5–5.3)
Sodium: 143 mmol/L (ref 135–146)
Total Bilirubin: 0.5 mg/dL (ref 0.2–1.2)
Total Protein: 6.7 g/dL (ref 6.1–8.1)

## 2019-11-18 LAB — LIPID PANEL
Cholesterol: 233 mg/dL — ABNORMAL HIGH (ref <200)
HDL: 80 mg/dL (ref >=50)
LDL Cholesterol (Calc): 127 mg/dL (calc) — ABNORMAL HIGH
Non-HDL Cholesterol (Calc): 153 mg/dL (calc) — ABNORMAL HIGH (ref <130)
Total CHOL/HDL Ratio: 2.9 (calc) (ref <5.0)
Triglycerides: 149 mg/dL (ref <150)

## 2019-11-18 LAB — THYROID PANEL WITH TSH
Free Thyroxine Index: 2.7 (ref 1.4–3.8)
T3 Uptake: 31 % (ref 22–35)
T4, Total: 8.8 ug/dL (ref 5.1–11.9)
TSH: 0.18 mIU/L — ABNORMAL LOW (ref 0.40–4.50)

## 2019-11-18 LAB — GAMMA GT: GGT: 11 U/L (ref 3–65)

## 2019-11-18 NOTE — Assessment & Plan Note (Signed)
Annual physical exam with new findings, see additional A/P.  Well adult with no acute concerns.  Plan: 1. Obtain health maintenance screenings as above according to age. - Increase physical activity to 30 minutes most days of the week.  - Eat healthy diet high in vegetables and fruits; low in refined carbohydrates. - Screening labs and tests as ordered 2. Return 1 year for annual physical.

## 2019-11-23 ENCOUNTER — Encounter: Payer: Self-pay | Admitting: Family Medicine

## 2019-11-23 ENCOUNTER — Other Ambulatory Visit: Payer: Self-pay

## 2019-11-23 ENCOUNTER — Ambulatory Visit
Admission: RE | Admit: 2019-11-23 | Discharge: 2019-11-23 | Disposition: A | Discharge status: Home / Self Care | Payer: PPO | Source: Ambulatory Visit | Attending: Family Medicine | Admitting: Family Medicine

## 2019-11-23 DIAGNOSIS — R599 Enlarged lymph nodes, unspecified: Secondary | ICD-10-CM | POA: Diagnosis not present

## 2019-11-24 ENCOUNTER — Encounter: Payer: Self-pay | Admitting: Family Medicine

## 2019-11-24 NOTE — Progress Notes (Signed)
COVID vaccine exemption letter due to long history of medication allergies and anaphylaxis written and printed to leave at front desk for patient.

## 2019-12-29 ENCOUNTER — Encounter: Payer: Self-pay | Admitting: *Deleted

## 2019-12-29 ENCOUNTER — Other Ambulatory Visit: Payer: Self-pay | Admitting: Oncology

## 2019-12-29 DIAGNOSIS — R911 Solitary pulmonary nodule: Secondary | ICD-10-CM

## 2019-12-29 NOTE — Progress Notes (Signed)
  Pulmonary Nodule Clinic Telephone Note [place]   Received referral from [facility], FNP.   HPI: Mr. Amy Rangel is a 64 year old female with past medical history significant for fibromyalgia, gastric ulcer and migraines who recently presented to [facility] emergency department for evaluation of abdominal and flank pain and diarrhea.  Work-up included IVF with basic labs along with CT imaging.  CT scan showed a 3.3 mm right lower pulmonary nodule.  Recommended follow-up in approximately 12 months.  Review and Recommendations: I personally reviewed all patient's previous imaging.  Unable to view [facility] records.  No previous imaging of the chest in our system.  I recommend follow-up with non contrast chest CT in 12 months from previous.  Social History: Patient is not a current smoker.  Social History   Tobacco Use  Smoking Status Former Smoker  . Quit date: 10/15/2018  . Years since quitting: 1.2  Smokeless Tobacco Never Used     High risk factors include: History of heavy smoking, exposure to asbestos, radium or uranium, personal family history of lung cancer, older age, sex (females greater than males), race (black and native [place] greater than weight), marginal speculation, upper lobe location, multiplicity (less than 5 nodules increases risk for malignancy) and emphysema and/or pulmonary fibrosis.   This recommendation follows the consensus statement: Guidelines for Management of Incidental Pulmonary Nodules Detected on CT Images: From the Fleischner Society 2017; Radiology 2017; 284:228-243.    I have placed order for CT scan without contrast to be completed approximately 12 months from previous.  Disposition: Order placed for repeat CT chest. Will notify Lenox Ponds in scheduling. [place] to call patient with appointment date and time. Return to pulmonary nodule clinic a few days after his repeat imaging to discuss results and plan moving forward.  Faythe Casa,  NP 12/29/2019 10:05 AM

## 2020-01-05 ENCOUNTER — Other Ambulatory Visit: Payer: Self-pay

## 2020-01-05 ENCOUNTER — Ambulatory Visit (LOCAL_COMMUNITY_HEALTH_CENTER): Payer: PPO

## 2020-01-05 DIAGNOSIS — Z111 Encounter for screening for respiratory tuberculosis: Secondary | ICD-10-CM

## 2020-01-06 DIAGNOSIS — F17200 Nicotine dependence, unspecified, uncomplicated: Secondary | ICD-10-CM | POA: Diagnosis not present

## 2020-01-06 DIAGNOSIS — M25562 Pain in left knee: Secondary | ICD-10-CM | POA: Diagnosis not present

## 2020-01-06 DIAGNOSIS — M79651 Pain in right thigh: Secondary | ICD-10-CM | POA: Diagnosis not present

## 2020-01-06 DIAGNOSIS — S32009A Unspecified fracture of unspecified lumbar vertebra, initial encounter for closed fracture: Secondary | ICD-10-CM | POA: Diagnosis not present

## 2020-01-06 DIAGNOSIS — M79605 Pain in left leg: Secondary | ICD-10-CM | POA: Diagnosis not present

## 2020-01-06 DIAGNOSIS — M546 Pain in thoracic spine: Secondary | ICD-10-CM | POA: Diagnosis not present

## 2020-01-06 DIAGNOSIS — M797 Fibromyalgia: Secondary | ICD-10-CM | POA: Diagnosis not present

## 2020-01-06 DIAGNOSIS — R293 Abnormal posture: Secondary | ICD-10-CM | POA: Diagnosis not present

## 2020-01-06 DIAGNOSIS — M6281 Muscle weakness (generalized): Secondary | ICD-10-CM | POA: Diagnosis not present

## 2020-01-06 DIAGNOSIS — M25561 Pain in right knee: Secondary | ICD-10-CM | POA: Diagnosis not present

## 2020-01-06 DIAGNOSIS — M545 Low back pain: Secondary | ICD-10-CM | POA: Diagnosis not present

## 2020-01-06 DIAGNOSIS — M542 Cervicalgia: Secondary | ICD-10-CM | POA: Diagnosis not present

## 2020-01-08 ENCOUNTER — Ambulatory Visit (LOCAL_COMMUNITY_HEALTH_CENTER): Payer: PPO

## 2020-01-08 ENCOUNTER — Other Ambulatory Visit: Payer: Self-pay

## 2020-01-08 DIAGNOSIS — Z111 Encounter for screening for respiratory tuberculosis: Secondary | ICD-10-CM

## 2020-01-08 LAB — TB SKIN TEST
Induration: 0 mm
TB Skin Test: NEGATIVE

## 2020-01-28 ENCOUNTER — Telehealth: Payer: Self-pay | Admitting: *Deleted

## 2020-01-28 NOTE — Telephone Encounter (Signed)
Referral received for pt to be seen in Lung Nodule Clinic for further workup of incidental lung nodule with follow up CT scan. Left message with patient to call back to discuss clinic and review recommendations and upcoming appts including follow up CT scan and visit with Jenny Burns, NP to discuss results. Awaiting call back.  

## 2020-01-29 ENCOUNTER — Other Ambulatory Visit: Payer: Self-pay

## 2020-01-29 ENCOUNTER — Ambulatory Visit (INDEPENDENT_AMBULATORY_CARE_PROVIDER_SITE_OTHER): Payer: PPO | Admitting: Family Medicine

## 2020-01-29 ENCOUNTER — Other Ambulatory Visit (HOSPITAL_COMMUNITY)
Admission: RE | Admit: 2020-01-29 | Discharge: 2020-01-29 | Disposition: A | Discharge status: Home / Self Care | Payer: PPO | Source: Ambulatory Visit | Attending: Family Medicine | Admitting: Family Medicine

## 2020-01-29 ENCOUNTER — Encounter: Payer: Self-pay | Admitting: Family Medicine

## 2020-01-29 VITALS — BP 122/46 | HR 71 | Temp 98.4°F | Resp 17 | Ht 62.0 in | Wt 142.0 lb

## 2020-01-29 DIAGNOSIS — Z113 Encounter for screening for infections with a predominantly sexual mode of transmission: Secondary | ICD-10-CM | POA: Insufficient documentation

## 2020-01-29 DIAGNOSIS — R300 Dysuria: Secondary | ICD-10-CM | POA: Diagnosis not present

## 2020-01-29 LAB — POCT URINALYSIS DIPSTICK
Bilirubin, UA: NEGATIVE
Blood, UA: NEGATIVE
Glucose, UA: NEGATIVE
Ketones, UA: NEGATIVE
Leukocytes, UA: NEGATIVE
Nitrite, UA: NEGATIVE
Protein, UA: NEGATIVE
Spec Grav, UA: 1.02 (ref 1.010–1.025)
Urobilinogen, UA: 0.2 E.U./dL
pH, UA: 5 (ref 5.0–8.0)

## 2020-01-29 MED ORDER — PHENAZOPYRIDINE HCL 100 MG PO TABS: 100.0000 mg | ORAL_TABLET | Freq: Three times a day (TID) | ORAL | 0 refills | Status: AC | PRN

## 2020-01-29 MED ORDER — NITROFURANTOIN MONOHYD MACRO 100 MG PO CAPS: 100.0000 mg | ORAL_CAPSULE | Freq: Two times a day (BID) | ORAL | 0 refills | Status: AC

## 2020-01-29 NOTE — Patient Instructions (Signed)
As we discussed, we are sending your urine to the lab for culture and for sexually transmitted disease testing.  We are sending your blood work to the lab for testing and will call when we receive the results.  Avoid all sexual contact until you receive your urine and blood work results.  Based on your symptoms, we will treat for urinary tract infection until we get your results back.  I have sent in a prescription for Macrobid, to take 1 tablet 2x per day for the next 5 days  Remember to always wipe front to back and empty your bladder pre- and post-intercourse to prevent UTIs.    Drink plenty of fluids.  I have sent in a prescription for Pyridium.    As we discussed. this will change the color of your urine to an orange/red, this is normal.    You may also experience some urinary dribbling, be sure to wear a pantyliner to protect your clothing from staining.  We will plan to see you back if symptoms worsen or fail to improve  You will receive a survey after today's visit either digitally by e-mail or paper by [employer] mail. Your experiences and feedback matter to [place].  Please respond so we know how we are doing as we provide care for you.  Call us with any questions/concerns/needs.  It is my goal to be available to you for your health concerns.  Thanks for choosing me to be a partner in your healthcare needs!  Harlin Rain, FNP-C Family Nurse Practitioner [facility] Phone: [phone]

## 2020-01-29 NOTE — Progress Notes (Signed)
Subjective:    Patient ID: Amy Rangel, female    DOB: 04-Dec-1955, 64 y.o.   MRN: [id number]  Amy Rangel is a 64 y.o. female presenting on 01/29/2020 for Dysuria (urinary frequency, urgency, and dysuria x 3 days )   HPI  Amy Rangel presents to clinic for concerns of dysuria, urinary frequency, urgency x 3 days.  Denies hesitancy, hematuria or feelings of incomplete emptying.  Denies vaginal discharge.  Requesting STI testing today.  Depression screen PHQ 2/9 10/15/2019  Decreased Interest 0  Down, Depressed, Hopeless 0  PHQ - 2 Score 0    Social History   Tobacco Use  . Smoking status: Former Smoker    Quit date: 10/15/2018    Years since quitting: 1.2  . Smokeless tobacco: Never Used  Vaping Use  . Vaping Use: Never used  Substance Use Topics  . Alcohol use: Not Currently  . Drug use: Not Currently    Review of Systems  Constitutional: Negative.   HENT: Negative.   Eyes: Negative.   Respiratory: Negative.   Cardiovascular: Negative.   Gastrointestinal: Negative.   Endocrine: Negative.   Genitourinary: Positive for dysuria, frequency and urgency. Negative for decreased urine volume, difficulty urinating, dyspareunia, enuresis, flank pain, genital sores, hematuria, menstrual problem, pelvic pain, vaginal bleeding, vaginal discharge and vaginal pain.  Musculoskeletal: Negative.   Skin: Negative.   Allergic/Immunologic: Negative.   Neurological: Negative.   Hematological: Negative.   Psychiatric/Behavioral: Negative.    Per HPI unless specifically indicated above     Objective:    BP (!) 122/46 (BP Location: Left Arm, Patient Position: Sitting, Cuff Size: Normal)   Pulse 71   Temp 98.4 F (36.9 C) (Oral)   Resp 17   Ht 5\' 2"  (1.575 m)   Wt 142 lb (64.4 kg)   SpO2 99%   BMI 25.97 kg/m   Wt Readings from Last 3 Encounters:  01/29/20 142 lb (64.4 kg)  11/17/19 141 lb 6.4 oz (64.1 kg)  10/22/19 141 lb 6.4 oz (64.1 kg)    Physical Exam Vitals reviewed.    Constitutional:      General: She is not in acute distress.    Appearance: Normal appearance. She is well-developed, well-groomed and overweight. She is not ill-appearing or toxic-appearing.  HENT:     Head: Normocephalic and atraumatic.     Nose:     Comments: Lizbeth Bark is in place, covering mouth and nose. Eyes:     General: Lids are normal. Vision grossly intact.        Right eye: No discharge.        Left eye: No discharge.     Extraocular Movements: Extraocular movements intact.     Conjunctiva/sclera: Conjunctivae normal.     Pupils: Pupils are equal, round, and reactive to light.  Cardiovascular:     Pulses: Normal pulses.          Dorsalis pedis pulses are 2+ on the right side and 2+ on the left side.  Pulmonary:     Effort: Pulmonary effort is normal. No respiratory distress.  Musculoskeletal:     Right lower leg: No edema.     Left lower leg: No edema.  Skin:    General: Skin is warm and dry.     Capillary Refill: Capillary refill takes less than 2 seconds.  Neurological:     General: No focal deficit present.     Mental Status: She is alert and oriented to person, place, and  time.  Psychiatric:        Attention and Perception: Attention and perception normal.        Mood and Affect: Mood and affect normal.        Speech: Speech normal.        Behavior: Behavior normal. Behavior is cooperative.        Thought Content: Thought content normal.        Cognition and Memory: Cognition and memory normal.        Judgment: Judgment normal.    Results for orders placed or performed in visit on 01/29/20  POCT Urinalysis Dipstick  Result Value Ref Range   Color, UA Yellow    Clarity, UA clear    Glucose, UA Negative Negative   Bilirubin, UA negative    Ketones, UA negative    Spec Grav, UA 1.020 1.010 - 1.025   Blood, UA negative    pH, UA 5.0 5.0 - 8.0   Protein, UA Negative Negative   Urobilinogen, UA 0.2 0.2 or 1.0 E.U./dL   Nitrite, UA negative    Leukocytes,  UA Negative Negative   Appearance     Odor        Assessment & Plan:   Problem List Items Addressed This Visit      Other   Dysuria - Primary    Likely UTI based on symptoms.  UA POCT negative in clinic, will send to lab for culture.  Will begin treatment based on symptoms with Macrobid 100mg , 1 tablet 2x per day for the next 5 days.  Will send in pyridium 100mg  to take 1 tablet TID PRN for urinary symptoms.    Plan: 1. Urine sent to lab for culture 2. Begin macrobid 100mg  BID x 5 days 3. Begin pyridium 100mg  TID PRN for urinary discomfort 4. RTC PRN      Relevant Medications   nitrofurantoin, macrocrystal-monohydrate, (MACROBID) 100 MG capsule   phenazopyridine (PYRIDIUM) 100 MG tablet   Other Relevant Orders   POCT Urinalysis Dipstick (Completed)   Urine Culture   Screening examination for sexually transmitted disease    Concerns for sexually transmitted infection testing due to recent infidelity of partner.  Denies any symptoms.  Plan: 1. Urine sent to the lab for GC/CT/Trich testing 2. Blood work sent to lab for HIV/RPR testing      Relevant Orders   HIV antibody (with reflex)   RPR   Urine cytology ancillary only      Meds ordered this encounter  Medications  . nitrofurantoin, macrocrystal-monohydrate, (MACROBID) 100 MG capsule    Sig: Take 1 capsule (100 mg total) by mouth 2 (two) times daily for 5 days.    Dispense:  10 capsule    Refill:  0  . phenazopyridine (PYRIDIUM) 100 MG tablet    Sig: Take 1 tablet (100 mg total) by mouth 3 (three) times daily as needed for up to 2 days for pain.    Dispense:  6 tablet    Refill:  0      Follow up plan: Return if symptoms worsen or fail to improve.   Harlin Rain, [place] Family Nurse Practitioner [facility] 01/29/2020, 4:21 PM

## 2020-01-29 NOTE — Assessment & Plan Note (Signed)
Concerns for sexually transmitted infection testing due to recent infidelity of partner.  Denies any symptoms.  Plan: 1. Urine sent to the lab for GC/CT/Trich testing 2. Blood work sent to lab for HIV/RPR testing

## 2020-01-29 NOTE — Assessment & Plan Note (Signed)
Likely UTI based on symptoms.  UA POCT negative in clinic, will send to lab for culture.  Will begin treatment based on symptoms with Macrobid 100mg , 1 tablet 2x per day for the next 5 days.  Will send in pyridium 100mg  to take 1 tablet TID PRN for urinary symptoms.    Plan: 1. Urine sent to lab for culture 2. Begin macrobid 100mg  BID x 5 days 3. Begin pyridium 100mg  TID PRN for urinary discomfort 4. RTC PRN

## 2020-01-30 LAB — URINE CULTURE
MICRO NUMBER:: 10770292
SPECIMEN QUALITY:: ADEQUATE

## 2020-02-01 LAB — RPR: RPR Ser Ql: NONREACTIVE

## 2020-02-01 LAB — HIV ANTIBODY (ROUTINE TESTING W REFLEX): HIV 1&2 Ab, 4th Generation: NONREACTIVE

## 2020-02-02 ENCOUNTER — Encounter: Payer: Self-pay | Admitting: Family Medicine

## 2020-02-02 LAB — URINE CYTOLOGY ANCILLARY ONLY
Chlamydia: NEGATIVE
Comment: NEGATIVE
Comment: NEGATIVE
Comment: NORMAL
Neisseria Gonorrhea: NEGATIVE
Trichomonas: NEGATIVE

## 2020-04-11 DIAGNOSIS — F17200 Nicotine dependence, unspecified, uncomplicated: Secondary | ICD-10-CM | POA: Diagnosis not present

## 2020-04-11 DIAGNOSIS — M545 Low back pain, unspecified: Secondary | ICD-10-CM | POA: Diagnosis not present

## 2020-04-11 DIAGNOSIS — M797 Fibromyalgia: Secondary | ICD-10-CM | POA: Diagnosis not present

## 2020-04-11 DIAGNOSIS — M25562 Pain in left knee: Secondary | ICD-10-CM | POA: Diagnosis not present

## 2020-04-11 DIAGNOSIS — M542 Cervicalgia: Secondary | ICD-10-CM | POA: Diagnosis not present

## 2020-04-11 DIAGNOSIS — S32009D Unspecified fracture of unspecified lumbar vertebra, subsequent encounter for fracture with routine healing: Secondary | ICD-10-CM | POA: Diagnosis not present

## 2020-04-11 DIAGNOSIS — M546 Pain in thoracic spine: Secondary | ICD-10-CM | POA: Diagnosis not present

## 2020-04-11 DIAGNOSIS — R293 Abnormal posture: Secondary | ICD-10-CM | POA: Diagnosis not present

## 2020-04-11 DIAGNOSIS — M79605 Pain in left leg: Secondary | ICD-10-CM | POA: Diagnosis not present

## 2020-04-11 DIAGNOSIS — M79651 Pain in right thigh: Secondary | ICD-10-CM | POA: Diagnosis not present

## 2020-04-11 DIAGNOSIS — M6281 Muscle weakness (generalized): Secondary | ICD-10-CM | POA: Diagnosis not present

## 2020-04-11 DIAGNOSIS — M25561 Pain in right knee: Secondary | ICD-10-CM | POA: Diagnosis not present

## 2020-04-21 ENCOUNTER — Encounter: Payer: Self-pay | Admitting: Family Medicine

## 2020-04-21 DIAGNOSIS — F419 Anxiety disorder, unspecified: Secondary | ICD-10-CM

## 2020-04-21 MED ORDER — CLONAZEPAM 0.5 MG PO TABS: 0.2500 mg | ORAL_TABLET | Freq: Every day | ORAL | 0 refills | Status: AC

## 2020-04-21 NOTE — Telephone Encounter (Signed)
Personally reviewed [place] today, 04/21/20.  According to PMP aware, pt last received a refill on 01/23/2020.  Refill of her controlled substance will be provided today.

## 2020-05-16 DIAGNOSIS — M818 Other osteoporosis without current pathological fracture: Secondary | ICD-10-CM | POA: Diagnosis not present

## 2020-06-06 DIAGNOSIS — H02403 Unspecified ptosis of bilateral eyelids: Secondary | ICD-10-CM | POA: Diagnosis not present

## 2020-06-06 DIAGNOSIS — H2513 Age-related nuclear cataract, bilateral: Secondary | ICD-10-CM | POA: Diagnosis not present

## 2020-06-06 DIAGNOSIS — H527 Unspecified disorder of refraction: Secondary | ICD-10-CM | POA: Diagnosis not present

## 2020-06-23 ENCOUNTER — Encounter: Payer: Self-pay | Admitting: Family Medicine

## 2020-06-23 ENCOUNTER — Other Ambulatory Visit: Payer: Self-pay

## 2020-06-23 ENCOUNTER — Ambulatory Visit (INDEPENDENT_AMBULATORY_CARE_PROVIDER_SITE_OTHER): Payer: PPO | Admitting: Family Medicine

## 2020-06-23 VITALS — BP 126/51 | HR 69 | Temp 97.3°F | Resp 18 | Ht 62.0 in | Wt 143.8 lb

## 2020-06-23 DIAGNOSIS — R5383 Other fatigue: Secondary | ICD-10-CM

## 2020-06-23 NOTE — Patient Instructions (Signed)
Have your labs drawn and we will contact you with the results  We will plan to see you back if your symptoms worsen or fail to improve  You will receive a survey after today's visit either digitally by e-mail or paper by USPS mail. Your experiences and feedback matter to us.  Please respond so we know how we are doing as we provide care for you.  Call us with any questions/concerns/needs.  It is my goal to be available to you for your health concerns.  Thanks for choosing me to be a partner in your healthcare needs!  Onofre Gains Marie Cimberly Stoffel, FNP-C Family Nurse Practitioner [facility] Phone: [phone]  

## 2020-06-23 NOTE — Progress Notes (Signed)
Subjective:    Patient ID: Amy Rangel, female    DOB: September 24, 1955, 64 y.o.   MRN: [id number]  Amy Rangel is a 64 y.o. female presenting on 06/23/2020 for Weight Gain (Dry skin, hair thinning, fatigue and brittle nails. The pt is concern that her thyroids levels might be out of range.)   HPI  Ms. Amy Rangel presents to clinic with concerns for weight gain, dry skin, thinning hair, fatigue and brittle nails.  Has concerns for her thyroid being out of range.  Reports she has not had a history of abnormal thyroid labs.  Denies any fevers, night sweats, lymphadenopathy, abdominal pain, n/v/d, change in bowel movements.  Requesting lab work today.  Depression screen PHQ 2/9 10/15/2019  Decreased Interest 0  Down, Depressed, Hopeless 0  PHQ - 2 Score 0    Social History   Tobacco Use  . Smoking status: Former Smoker    Quit date: 10/15/2018    Years since quitting: 1.7  . Smokeless tobacco: Never Used  Vaping Use  . Vaping Use: Never used  Substance Use Topics  . Alcohol use: Not Currently  . Drug use: Not Currently    Review of Systems  Constitutional: Positive for fatigue and unexpected weight change. Negative for activity change, appetite change, chills, diaphoresis and fever.  HENT: Negative.   Eyes: Negative.   Respiratory: Negative.   Cardiovascular: Negative.   Gastrointestinal: Negative.   Endocrine: Negative.   Genitourinary: Negative.   Musculoskeletal: Negative.   Skin: Negative.        Hair thinning, brittle nails, dry skin  Allergic/Immunologic: Negative.   Neurological: Negative.   Hematological: Negative.   Psychiatric/Behavioral: Negative.    Per HPI unless specifically indicated above     Objective:    BP (!) 126/51 (BP Location: Right Arm, Patient Position: Sitting, Cuff Size: Normal)   Pulse 69   Temp (!) 97.3 F (36.3 C) (Oral)   Resp 18   Ht 5\' 2"  (1.575 m)   Wt 143 lb 12.8 oz (65.2 kg)   SpO2 100%   BMI 26.30 kg/m   Wt Readings from Last  3 Encounters:  06/23/20 143 lb 12.8 oz (65.2 kg)  01/29/20 142 lb (64.4 kg)  11/17/19 141 lb 6.4 oz (64.1 kg)    Physical Exam Vitals and nursing note reviewed.  Constitutional:      General: She is not in acute distress.    Appearance: Normal appearance. She is well-developed and well-groomed. She is not ill-appearing or toxic-appearing.  HENT:     Head: Normocephalic and atraumatic.     Nose:     Comments: Lizbeth Bark is in place, covering mouth and nose. Eyes:     General: Lids are normal. Vision grossly intact.        Right eye: No discharge.        Left eye: No discharge.     Extraocular Movements: Extraocular movements intact.     Conjunctiva/sclera: Conjunctivae normal.     Pupils: Pupils are equal, round, and reactive to light.  Neck:     Thyroid: No thyroid mass, thyromegaly or thyroid tenderness.  Cardiovascular:     Rate and Rhythm: Normal rate and regular rhythm.     Pulses: Normal pulses.     Heart sounds: Normal heart sounds. No murmur heard. No friction rub. No gallop.   Pulmonary:     Effort: Pulmonary effort is normal. No respiratory distress.     Breath sounds: Normal breath sounds.  Lymphadenopathy:     Cervical: No cervical adenopathy.  Skin:    General: Skin is warm and dry.     Capillary Refill: Capillary refill takes less than 2 seconds.     Comments: Nails with vertical ridges, thin nails down to nail bed on a few fingertips  Neurological:     General: No focal deficit present.     Mental Status: She is alert and oriented to person, place, and time.  Psychiatric:        Attention and Perception: Attention and perception normal.        Mood and Affect: Mood and affect normal.        Speech: Speech normal.        Behavior: Behavior normal. Behavior is cooperative.        Thought Content: Thought content normal.        Cognition and Memory: Cognition and memory normal.        Judgment: Judgment normal.    Results for orders placed or performed in  visit on 01/29/20  Urine Culture   Specimen: Urine  Result Value Ref Range   MICRO NUMBER: [id number]    SPECIMEN QUALITY: Adequate    Sample Source URINE    STATUS: FINAL    Result:      Growth of mixed flora was isolated, suggesting probable contamination. No further testing will be performed. If clinically indicated, recollection using a method to minimize contamination, with prompt transfer to Urine Culture Transport Tube, is  recommended.   HIV antibody (with reflex)  Result Value Ref Range   HIV 1&2 Ab, 4th Generation NON-REACTIVE NON-REACTI  RPR  Result Value Ref Range   RPR Ser Ql NON-REACTIVE NON-REACTI  POCT Urinalysis Dipstick  Result Value Ref Range   Color, UA Yellow    Clarity, UA clear    Glucose, UA Negative Negative   Bilirubin, UA negative    Ketones, UA negative    Spec Grav, UA 1.020 1.010 - 1.025   Blood, UA negative    pH, UA 5.0 5.0 - 8.0   Protein, UA Negative Negative   Urobilinogen, UA 0.2 0.2 or 1.0 E.U./dL   Nitrite, UA negative    Leukocytes, UA Negative Negative   Appearance     Odor    Urine cytology ancillary only  Result Value Ref Range   Trichomonas Negative    Chlamydia Negative    Neisseria Gonorrhea Negative    Comment Normal Reference Ranger Chlamydia - Negative    Comment Normal Reference Range Trichomonas - Negative    Comment      Normal Reference Range Neisseria Gonorrhea - Negative      Assessment & Plan:   Problem List Items Addressed This Visit      Other   Other fatigue - Primary    Will have labs drawn for assessment of fatigue.  Negative STOP-BANG screening for OSA.  Will have labs drawn and based on results determine next steps in treatment plan.      Relevant Orders   CBC with Differential   COMPLETE METABOLIC PANEL WITH GFR   TSH + free T4   Heavy Metals Panel, Blood   VITAMIN D 25 Hydroxy (Vit-D Deficiency, Fractures)   B12      No orders of the defined types were placed in this encounter.  Follow up  plan: Return if symptoms worsen or fail to improve.   Amy Rangel, [place] Family Nurse Practitioner [facility] 06/23/2020, 2:59 PM

## 2020-06-27 ENCOUNTER — Encounter: Payer: Self-pay | Admitting: Family Medicine

## 2020-06-27 ENCOUNTER — Other Ambulatory Visit: Payer: PPO

## 2020-06-27 ENCOUNTER — Other Ambulatory Visit: Payer: Self-pay

## 2020-06-27 DIAGNOSIS — R5383 Other fatigue: Secondary | ICD-10-CM | POA: Insufficient documentation

## 2020-06-27 NOTE — Assessment & Plan Note (Signed)
Will have labs drawn for assessment of fatigue.  Negative STOP-BANG screening for OSA.  Will have labs drawn and based on results determine next steps in treatment plan.

## 2020-06-28 ENCOUNTER — Encounter: Payer: Self-pay | Admitting: Family Medicine

## 2020-06-28 ENCOUNTER — Other Ambulatory Visit: Payer: Self-pay | Admitting: Family Medicine

## 2020-06-28 DIAGNOSIS — R7989 Other specified abnormal findings of blood chemistry: Secondary | ICD-10-CM

## 2020-06-29 LAB — CBC WITH DIFFERENTIAL/PLATELET
Absolute Monocytes: 286 cells/uL (ref 200–950)
Basophils Absolute: 11 cells/uL (ref 0–200)
Basophils Relative: 0.2 %
Eosinophils Absolute: 108 cells/uL (ref 15–500)
Eosinophils Relative: 2 %
HCT: 40.2 % (ref 35.0–45.0)
Hemoglobin: 13.5 g/dL (ref 11.7–15.5)
Lymphs Abs: 1917 cells/uL (ref 850–3900)
MCH: 29.7 pg (ref 27.0–33.0)
MCHC: 33.6 g/dL (ref 32.0–36.0)
MCV: 88.4 fL (ref 80.0–100.0)
MPV: 10.8 fL (ref 7.5–12.5)
Monocytes Relative: 5.3 %
Neutro Abs: 3078 cells/uL (ref 1500–7800)
Neutrophils Relative %: 57 %
Platelets: 293 10*3/uL (ref 140–400)
RBC: 4.55 10*6/uL (ref 3.80–5.10)
RDW: 12.7 % (ref 11.0–15.0)
Total Lymphocyte: 35.5 %
WBC: 5.4 10*3/uL (ref 3.8–10.8)

## 2020-06-29 LAB — COMPLETE METABOLIC PANEL WITH GFR
AG Ratio: 1.9 (calc) (ref 1.0–2.5)
ALT: 16 U/L (ref 6–29)
AST: 17 U/L (ref 10–35)
Albumin: 4.3 g/dL (ref 3.6–5.1)
Alkaline phosphatase (APISO): 124 U/L (ref 37–153)
BUN: 14 mg/dL (ref 7–25)
CO2: 30 mmol/L (ref 20–32)
Calcium: 9.6 mg/dL (ref 8.6–10.4)
Chloride: 105 mmol/L (ref 98–110)
Creat: 0.74 mg/dL (ref 0.50–0.99)
GFR, Est African American: 98 mL/min/{1.73_m2} (ref >=60)
GFR, Est Non African American: 84 mL/min/{1.73_m2} (ref >=60)
Globulin: 2.3 g/dL (calc) (ref 1.9–3.7)
Glucose, Bld: 92 mg/dL (ref 65–99)
Potassium: 3.5 mmol/L (ref 3.5–5.3)
Sodium: 144 mmol/L (ref 135–146)
Total Bilirubin: 0.4 mg/dL (ref 0.2–1.2)
Total Protein: 6.6 g/dL (ref 6.1–8.1)

## 2020-06-29 LAB — VITAMIN D 25 HYDROXY (VIT D DEFICIENCY, FRACTURES): Vit D, 25-Hydroxy: 36 ng/mL (ref 30–100)

## 2020-06-29 LAB — TSH+FREE T4: TSH W/REFLEX TO FT4: 0.29 mIU/L — ABNORMAL LOW (ref 0.40–4.50)

## 2020-06-29 LAB — T4, FREE: Free T4: 1.1 ng/dL (ref 0.8–1.8)

## 2020-06-29 LAB — HEAVY METALS PANEL, BLOOD
Arsenic: <10 mcg/L (ref <23)
Lead: 1 ug/dL (ref <5)
Mercury, B: <5 mcg/L (ref 0–10)

## 2020-06-29 LAB — VITAMIN B12: Vitamin B-12: 551 pg/mL (ref 200–1100)

## 2020-08-02 DIAGNOSIS — M6281 Muscle weakness (generalized): Secondary | ICD-10-CM | POA: Diagnosis not present

## 2020-08-02 DIAGNOSIS — M797 Fibromyalgia: Secondary | ICD-10-CM | POA: Diagnosis not present

## 2020-08-02 DIAGNOSIS — M79651 Pain in right thigh: Secondary | ICD-10-CM | POA: Diagnosis not present

## 2020-08-02 DIAGNOSIS — M5412 Radiculopathy, cervical region: Secondary | ICD-10-CM | POA: Diagnosis not present

## 2020-08-02 DIAGNOSIS — M545 Low back pain, unspecified: Secondary | ICD-10-CM | POA: Diagnosis not present

## 2020-08-02 DIAGNOSIS — F17200 Nicotine dependence, unspecified, uncomplicated: Secondary | ICD-10-CM | POA: Diagnosis not present

## 2020-08-02 DIAGNOSIS — S32009D Unspecified fracture of unspecified lumbar vertebra, subsequent encounter for fracture with routine healing: Secondary | ICD-10-CM | POA: Diagnosis not present

## 2020-08-02 DIAGNOSIS — M79605 Pain in left leg: Secondary | ICD-10-CM | POA: Diagnosis not present

## 2020-08-02 DIAGNOSIS — R293 Abnormal posture: Secondary | ICD-10-CM | POA: Diagnosis not present

## 2020-08-02 DIAGNOSIS — M546 Pain in thoracic spine: Secondary | ICD-10-CM | POA: Diagnosis not present

## 2020-08-02 DIAGNOSIS — M25562 Pain in left knee: Secondary | ICD-10-CM | POA: Diagnosis not present

## 2020-08-02 DIAGNOSIS — M25561 Pain in right knee: Secondary | ICD-10-CM | POA: Diagnosis not present

## 2020-09-06 DIAGNOSIS — R946 Abnormal results of thyroid function studies: Secondary | ICD-10-CM | POA: Diagnosis not present

## 2020-09-06 DIAGNOSIS — E0590 Thyrotoxicosis, unspecified without thyrotoxic crisis or storm: Secondary | ICD-10-CM | POA: Diagnosis not present

## 2020-09-06 DIAGNOSIS — R7989 Other specified abnormal findings of blood chemistry: Secondary | ICD-10-CM | POA: Diagnosis not present

## 2020-09-07 ENCOUNTER — Other Ambulatory Visit: Payer: Self-pay | Admitting: Internal Medicine

## 2020-09-07 DIAGNOSIS — R7989 Other specified abnormal findings of blood chemistry: Secondary | ICD-10-CM

## 2020-09-14 ENCOUNTER — Other Ambulatory Visit: Payer: Self-pay

## 2020-09-14 ENCOUNTER — Ambulatory Visit
Admission: RE | Admit: 2020-09-14 | Discharge: 2020-09-14 | Disposition: A | Discharge status: Home / Self Care | Payer: PPO | Source: Ambulatory Visit | Attending: Internal Medicine | Admitting: Internal Medicine

## 2020-09-14 DIAGNOSIS — R7989 Other specified abnormal findings of blood chemistry: Secondary | ICD-10-CM | POA: Diagnosis not present

## 2020-09-14 DIAGNOSIS — E042 Nontoxic multinodular goiter: Secondary | ICD-10-CM | POA: Diagnosis not present

## 2020-09-19 DIAGNOSIS — E0590 Thyrotoxicosis, unspecified without thyrotoxic crisis or storm: Secondary | ICD-10-CM | POA: Diagnosis not present

## 2020-09-23 ENCOUNTER — Other Ambulatory Visit: Payer: Self-pay | Admitting: Internal Medicine

## 2020-09-23 DIAGNOSIS — E0520 Thyrotoxicosis with toxic multinodular goiter without thyrotoxic crisis or storm: Secondary | ICD-10-CM

## 2020-10-05 ENCOUNTER — Encounter: Admission: RE | Admit: 2020-10-05 | Payer: PPO | Source: Ambulatory Visit

## 2020-10-06 ENCOUNTER — Other Ambulatory Visit: Payer: Self-pay

## 2020-10-06 ENCOUNTER — Encounter
Admission: RE | Admit: 2020-10-06 | Discharge: 2020-10-06 | Disposition: A | Discharge status: Home / Self Care | Payer: PPO | Source: Ambulatory Visit | Attending: Internal Medicine | Admitting: Internal Medicine

## 2020-10-06 DIAGNOSIS — E0520 Thyrotoxicosis with toxic multinodular goiter without thyrotoxic crisis or storm: Secondary | ICD-10-CM | POA: Diagnosis not present

## 2020-10-06 MED ORDER — SODIUM IODIDE I-123 7.4 MBQ CAPS
282.2800 | ORAL_CAPSULE | Freq: Once | ORAL | Status: AC
  Administered 2020-10-06: 282.28 via ORAL

## 2020-10-07 ENCOUNTER — Encounter
Admission: RE | Admit: 2020-10-07 | Discharge: 2020-10-07 | Disposition: A | Discharge status: Home / Self Care | Payer: PPO | Source: Ambulatory Visit | Attending: Internal Medicine | Admitting: Internal Medicine

## 2020-10-07 DIAGNOSIS — E042 Nontoxic multinodular goiter: Secondary | ICD-10-CM | POA: Diagnosis not present

## 2020-10-07 DIAGNOSIS — E0500 Thyrotoxicosis with diffuse goiter without thyrotoxic crisis or storm: Secondary | ICD-10-CM | POA: Diagnosis not present

## 2020-10-13 ENCOUNTER — Encounter
Admission: RE | Admit: 2020-10-13 | Discharge: 2020-10-13 | Disposition: A | Discharge status: Home / Self Care | Payer: PPO | Source: Ambulatory Visit | Attending: Internal Medicine | Admitting: Internal Medicine

## 2020-10-13 ENCOUNTER — Other Ambulatory Visit: Payer: Self-pay

## 2020-10-13 DIAGNOSIS — E0520 Thyrotoxicosis with toxic multinodular goiter without thyrotoxic crisis or storm: Secondary | ICD-10-CM | POA: Diagnosis not present

## 2020-10-13 DIAGNOSIS — E0590 Thyrotoxicosis, unspecified without thyrotoxic crisis or storm: Secondary | ICD-10-CM | POA: Diagnosis not present

## 2020-10-13 MED ORDER — SODIUM IODIDE I 131 CAPSULE
30.8720 | Freq: Once | INTRAVENOUS | Status: AC | PRN
  Administered 2020-10-13: 30.872 via ORAL

## 2020-10-25 DIAGNOSIS — M25561 Pain in right knee: Secondary | ICD-10-CM | POA: Diagnosis not present

## 2020-10-25 DIAGNOSIS — Z79891 Long term (current) use of opiate analgesic: Secondary | ICD-10-CM | POA: Diagnosis not present

## 2020-10-25 DIAGNOSIS — M545 Low back pain, unspecified: Secondary | ICD-10-CM | POA: Diagnosis not present

## 2020-10-25 DIAGNOSIS — M797 Fibromyalgia: Secondary | ICD-10-CM | POA: Diagnosis not present

## 2020-10-25 DIAGNOSIS — M546 Pain in thoracic spine: Secondary | ICD-10-CM | POA: Diagnosis not present

## 2020-10-25 DIAGNOSIS — M79651 Pain in right thigh: Secondary | ICD-10-CM | POA: Diagnosis not present

## 2020-10-25 DIAGNOSIS — Z5181 Encounter for therapeutic drug level monitoring: Secondary | ICD-10-CM | POA: Diagnosis not present

## 2020-10-25 DIAGNOSIS — M6281 Muscle weakness (generalized): Secondary | ICD-10-CM | POA: Diagnosis not present

## 2020-10-25 DIAGNOSIS — R293 Abnormal posture: Secondary | ICD-10-CM | POA: Diagnosis not present

## 2020-10-25 DIAGNOSIS — M5412 Radiculopathy, cervical region: Secondary | ICD-10-CM | POA: Diagnosis not present

## 2020-10-25 DIAGNOSIS — M25562 Pain in left knee: Secondary | ICD-10-CM | POA: Diagnosis not present

## 2020-10-25 DIAGNOSIS — M79605 Pain in left leg: Secondary | ICD-10-CM | POA: Diagnosis not present

## 2020-10-25 DIAGNOSIS — Z79899 Other long term (current) drug therapy: Secondary | ICD-10-CM | POA: Diagnosis not present

## 2020-10-25 DIAGNOSIS — F17200 Nicotine dependence, unspecified, uncomplicated: Secondary | ICD-10-CM | POA: Diagnosis not present

## 2020-10-26 ENCOUNTER — Other Ambulatory Visit: Payer: Self-pay

## 2020-10-26 ENCOUNTER — Ambulatory Visit
Admission: RE | Admit: 2020-10-26 | Discharge: 2020-10-26 | Disposition: A | Discharge status: Home / Self Care | Payer: PPO | Source: Ambulatory Visit | Attending: Oncology | Admitting: Oncology

## 2020-10-26 DIAGNOSIS — R911 Solitary pulmonary nodule: Secondary | ICD-10-CM | POA: Insufficient documentation

## 2020-10-26 DIAGNOSIS — R918 Other nonspecific abnormal finding of lung field: Secondary | ICD-10-CM | POA: Diagnosis not present

## 2020-10-26 DIAGNOSIS — I2510 Atherosclerotic heart disease of native coronary artery without angina pectoris: Secondary | ICD-10-CM | POA: Diagnosis not present

## 2020-10-27 ENCOUNTER — Encounter: Payer: Self-pay | Admitting: *Deleted

## 2020-10-27 ENCOUNTER — Inpatient Hospital Stay: Payer: PPO | Admitting: Oncology

## 2020-10-28 ENCOUNTER — Other Ambulatory Visit: Payer: Self-pay

## 2020-10-28 ENCOUNTER — Inpatient Hospital Stay: Payer: PPO | Attending: Oncology | Admitting: Oncology

## 2020-10-28 DIAGNOSIS — R911 Solitary pulmonary nodule: Secondary | ICD-10-CM | POA: Diagnosis not present

## 2020-10-28 NOTE — Progress Notes (Signed)
Pulmonary Nodule Clinic Consult note [facility]  [phone] [phone]  Patient Care Team: Malfi, Amy Raider, FNP as PCP - General (Family Medicine) Lorine Bears Amy Raider, FNP (Family Medicine)   Name of the patient: Amy Rangel  [id number]  [birth year]   Date of visit: 10/28/2020   Diagnosis- Lung Nodule  Virtual Visit via Telephone Note   I connected with Amy Rangel on 10/28/20 at 10am by telephone visit and verified that I am speaking with the correct person using two identifiers.   I discussed the limitations, risks, security and privacy concerns of performing an evaluation and management service by telemedicine and the availability of in-person appointments. I also discussed with the patient that there may be a patient responsible charge related to this service. The patient expressed understanding and agreed to proceed.   Other persons participating in the visit and their role in the encounter:   Patient's location: Home    Provider's location: Clinic   Chief complaint/ Reason for visit- Pulmonary Nodule Clinic Initial Visit  Past Medical History:  Amy Rangel is a 65 year old female with past medical history significant for fibromyalgia, gastric ulcer and migraines who recently presented to [facility] emergency department for evaluation of abdominal and flank pain and diarrhea.  Work-up included IVF with basic labs along with CT imaging.  CT scan showed a 3.3 mm right lower pulmonary nodule.  Recommended follow-up in approximately 12 months.  Interval history-Mrs. Earleen Rangel presents today to review most recent CT scan.   She is a former smoker.  She quit in 2019.  States she started smoking at the age of 21.  She smoked for approximately 24 years.  She smoked less than 1 pack of cigarettes per day typically 10 to 15 cigarettes daily.  She quit multiple times in the past.   Recently had XRT to thyroid for overactive thyroid. U/S of thyroid from 09/14/20  showed 5 nodules and 2 that were spongiform. Endorses a sore throat since treatment.   She retired about 1 year ago.  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  She did not wear protective eye or face wear.  Denies a personal history of cancer.  Has a family history of brain cancer of both of her maternal grandparents and stomach cancer in her paternal grandfather.  Her mother had breast cancer which metastasized to her lymph nodes.  Currently doing well. Migraines are better.  She is enjoying being retired.  She is able to exercise daily.  Denies any new concerns.  ECOG FS:0 - Asymptomatic  Review of systems- Review of Systems  Constitutional: Negative.  Negative for chills, fever, malaise/fatigue and weight loss.  HENT: Positive for sore throat. Negative for congestion, ear pain and tinnitus.   Eyes: Negative.  Negative for blurred vision and double vision.  Respiratory: Negative.  Negative for cough, sputum production and shortness of breath.   Cardiovascular: Negative.  Negative for chest pain, palpitations and leg swelling.  Gastrointestinal: Negative.  Negative for abdominal pain, constipation, diarrhea, nausea and vomiting.  Genitourinary: Negative for dysuria, frequency and urgency.  Musculoskeletal: Negative for back pain and falls.  Skin: Negative.  Negative for rash.  Neurological: Negative.  Negative for weakness and headaches.  Endo/Heme/Allergies: Negative.  Does not bruise/bleed easily.  Psychiatric/Behavioral: Negative.  Negative for depression. The patient is not nervous/anxious and  does not have insomnia.      Allergies  Allergen Reactions  . Bupropion Swelling    Lip swelling   . Citrullus Vulgaris Other (See Comments), Rash and Swelling  . Gabapentin Other (See Comments) and Shortness Of Breath    "Acid reflux and chest  pressure"   . Iodinated Diagnostic Agents Anaphylaxis    Uncoded Allergy. Allergen: ct contrast dye (with steroids), Other Reaction: THROAT TIGHTNING  . Iodine Other (See Comments)    Other reaction(s): Other (See Comments) Other Reaction: ct contrast & xray dye Other Reaction: ct contrast & xray dye   . Iopamidol Other (See Comments)    Other reaction(s): Other (See Comments) Other Reaction: other reaction Other Reaction: other reaction   . Levetiracetam Other (See Comments) and Swelling    Facial swelling, upset stomach Swelling of face and felt drunk   . Mangifera Indica Rash and Swelling  . Neomycin-Polymyxin-Dexameth Rash and Swelling  . Papaya Rash and Swelling  . Prednisone Anaphylaxis  . Sulfa Antibiotics Anaphylaxis and Other (See Comments)    Uncoded Allergy. Allergen: xray dye Uncoded Allergy. Allergen: ct contrast dye (with steroids), Other Reaction: THROAT TIGHTNING   . Zolpidem Diarrhea  . Teriparatide Other (See Comments)    Patient states that when she took this medication she had a sore throat and was very fatigue. Patient states that when she took this medication she had a sore throat and was very fatigue.   Marland Kitchen Aluminum Other (See Comments)    Growths Growths   . Amoxicillin     Other reaction(s): Unknown Other reaction(s): Unknown   . Atorvastatin Diarrhea  . Codeine Other (See Comments)    Other reaction(s): Other (See Comments) Other Reaction: NOT ASSESSED Other Reaction: NOT ASSESSED   . Diclofenac Potassium Diarrhea  . Duloxetine Diarrhea  . Duloxetine Hcl     Other reaction(s): Other (See Comments) constipation  . Erythromycin Swelling    Swelling of face   . Escitalopram Other (See Comments)    "Insomnia" "Insomnia"   . Eslicarbazepine Diarrhea and Other (See Comments)    Diarrhea, insomnia  . Hepatitis B Vaccine Other (See Comments)    Chest pain and arm pain   . Ibuprofen Other (See Comments)    "Stomach upset real  bad" "Stomach upset real bad"   . Ketoprofen Other (See Comments)    "Stomach upset really bad"   . Lanolin Other (See Comments)    Other reaction(s): Other (See Comments) Other Reaction: NOT ASSESSED Other Reaction: NOT ASSESSED   . Metoclopramide     error  . Midodrine Hcl Other (See Comments)    headache  . Monosodium Glutamate     Other reaction(s): Unknown Other reaction(s): Unknown   . Morphine Nausea And Vomiting    vomiting  . Moxifloxacin Other (See Comments)    Other reaction(s): Other (See Comments) Other Reaction: OTHER REACTION Other Reaction: OTHER REACTION   . Moxifloxacin Hcl   . Naproxen   . Naproxen Sodium Other (See Comments)    Bloody stools Bloody stool   . Other     Client reports 35 allergies. Copy of allergies scanned. Garlan Fair RN, [facility]  . Oxycodone   . Oxycodone-Acetaminophen Other (See Comments)    Other reaction(s): Other (See Comments) Other Reaction: NOT ASSESSED Other Reaction: NOT ASSESSED   . Sulfamethoxazole-Trimethoprim   . Trimethoprim   . Zonisamide Other (See Comments)    Bladder infection    . Lamotrigine Rash  .  Neomycin-Polymyxin B Gu Rash  . Sertraline Rash  . Sertraline Hcl Rash  . Valproic Acid Nausea Only     Past Medical History:  Diagnosis Date  . Cancer ([place])    skin ca     Past Surgical History:  Procedure Laterality Date  . ABDOMINAL HYSTERECTOMY    . BREAST BIOPSY Left 1995   neg  . BREAST SURGERY Left 1993   Left lumpectomy/partial mastectomy  . foot surgery Right    Right foot ganglion cyst removal  . TONSILLECTOMY      Social History   Socioeconomic History  . Marital status: Divorced    Spouse name: Not on file  . Number of children: Not on file  . Years of education: Not on file  . Highest education level: Not on file  Occupational History  . Not on file  Tobacco Use  . Smoking status: Former Smoker    Quit date: 10/15/2018    Years since quitting:  2.0  . Smokeless tobacco: Never Used  Vaping Use  . Vaping Use: Never used  Substance and Sexual Activity  . Alcohol use: Not Currently  . Drug use: Not Currently  . Sexual activity: Not on file  Other Topics Concern  . Not on file  Social History Narrative  . Not on file   Social Determinants of Health   Financial Resource Strain: Not on file  Food Insecurity: Not on file  Transportation Needs: Not on file  Physical Activity: Not on file  Stress: Not on file  Social Connections: Not on file  Intimate Partner Violence: Not on file    Family History  Problem Relation Age of Onset  . Hypothyroidism Mother   . Breast cancer Mother 70  . Leukemia Father      Current Outpatient Medications:  .  butalbital-acetaminophen-caffeine (FIORICET) 50-325-40 MG tablet, Take by mouth., Disp: , Rfl:  .  Calcium Carb-Cholecalciferol (CALCIUM 1000 + D PO), Take by mouth., Disp: , Rfl:  .  Cholecalciferol 50 MCG (2000 UT) TABS, Take by mouth., Disp: , Rfl:  .  clonazePAM (KLONOPIN) 0.5 MG tablet, Take 0.5 tablets (0.25 mg total) by mouth daily., Disp: 90 tablet, Rfl: 0 .  cyclobenzaprine (FLEXERIL) 5 MG tablet, Take 5 mg by mouth 3 (three) times daily., Disp: , Rfl:  .  GNP GARLIC EXTRACT PO, Take by mouth., Disp: , Rfl:  .  Magnesium Gluconate 550 MG TABS, Take by mouth., Disp: , Rfl:  .  Omega 3 1000 MG CAPS, Take by mouth., Disp: , Rfl:  .  Riboflavin (B-2-400 PO), Take by mouth., Disp: , Rfl:  .  traMADol (ULTRAM) 50 MG tablet, Take 50 mg by mouth daily as needed. , Disp: , Rfl:  .  Turmeric Curcumin 500 MG CAPS, Take by mouth., Disp: , Rfl:  .  Zinc Sulfate 66 MG TABS, Take by mouth., Disp: , Rfl:   Physical exam: There were no vitals filed for this visit. Physical Exam Neurological:     Mental Status: She is alert and oriented to person, place, and time.      CMP Latest Ref Rng & Units 06/27/2020  Glucose 65 - 99 mg/dL 92  BUN 7 - 25 mg/dL 14  Creatinine 0.50 - 0.99 mg/dL  0.74  Sodium 135 - 146 mmol/L 144  Potassium 3.5 - 5.3 mmol/L 3.5  Chloride 98 - 110 mmol/L 105  CO2 20 - 32 mmol/L 30  Calcium 8.6 - 10.4 mg/dL 9.6  Total  Protein 6.1 - 8.1 g/dL 6.6  Total Bilirubin 0.2 - 1.2 mg/dL 0.4  AST 10 - 35 U/L 17  ALT 6 - 29 U/L 16   CBC Latest Ref Rng & Units 06/27/2020  WBC 3.8 - 10.8 Thousand/uL 5.4  Hemoglobin 11.7 - 15.5 g/dL 13.5  Hematocrit 35.0 - 45.0 % 40.2  Platelets 140 - 400 Thousand/uL 293    No images are attached to the encounter.  CT Chest Wo Contrast  Result Date: 10/26/2020 CLINICAL DATA:  Pulmonary nodule follow-up. EXAM: CT CHEST WITHOUT CONTRAST TECHNIQUE: Multidetector CT imaging of the chest was performed following the standard protocol without IV contrast. COMPARISON:  None. FINDINGS: Cardiovascular: The heart size is normal. No substantial pericardial effusion. Coronary artery calcification is evident. No thoracic aortic aneurysm. Mediastinum/Nodes: Bilateral thyroid nodules evident measuring up to 1.9 cm on the left. No mediastinal lymphadenopathy. No evidence for gross hilar lymphadenopathy although assessment is limited by the lack of intravenous contrast on today's study. The esophagus has normal imaging features. There is no axillary lymphadenopathy. Lungs/Pleura: 4 mm right lower lobe pulmonary nodule identified on image 105/3. Small right lower lobe perifissural nodules are probably lymph nodes, measuring 6 mm on 71/3 and 4 mm on 75/3. 4 mm left lower lobe nodule evident on image 90/3. No focal airspace consolidation. No pleural effusion. Upper Abdomen: Visualized upper abdomen unremarkable on this noncontrast study. There may be some layering sludge in the gallbladder. Musculoskeletal: No worrisome lytic or sclerotic osseous abnormality. Superior endplate compression deformity noted L1. IMPRESSION: 1. Tiny bilateral pulmonary nodules measuring up to 6 mm. Non-contrast chest CT at 3-6 months is recommended. If the nodules are stable at  time of repeat CT, then future CT at 18-24 months (from today's scan) is considered optional for low-risk patients, but is recommended for high-risk patients. This recommendation follows the consensus statement: Guidelines for Management of Incidental Pulmonary Nodules Detected on CT Images: From the Fleischner Society 2017; Radiology 2017; 284:228-243. 2. Bilateral thyroid nodules measuring up to 1.9 cm on the left. This has been evaluated on previous imaging. (ref: J Am Coll Radiol. 2015 Feb;12(2): 143-50). 3. Superior endplate compression deformity L1. Electronically Signed   By: Misty Stanley M.D.   On: 10/26/2020 13:42   NM THYROID MULT UPTAKE W/IMAGING  Result Date: 10/07/2020 CLINICAL DATA:  Toxic multinodular goiter EXAM: THYROID SCAN AND UPTAKE - 4 AND 24 HOURS TECHNIQUE: Following oral administration of I-123 capsule, anterior planar imaging was acquired at 24 hours. Thyroid uptake was calculated with a thyroid probe at 4-6 hours and 24 hours. RADIOPHARMACEUTICALS:  282.28 uCi I-123 sodium iodide p.o. COMPARISON:  Thyroid ultrasound 09/14/2020 FINDINGS: Nodular areas of increased and decreased tracer uptake are seen in both thyroid lobes consistent with multinodular thyroid gland. No dominant cold nodule identified. Most prominent warm nodule is at inferior pole LEFT lobe. 4 hour I-123 uptake = 14.7% (normal 5-20%) 24 hour I-123 uptake = 32.8% (normal 10-30%) IMPRESSION: Multinodular thyroid gland. Mildly elevated 24 hour radio iodine uptake. Findings consistent with provided history of toxic multinodular goiter. Electronically Signed   By: Lavonia Dana M.D.   On: 10/07/2020 11:00   NM RAI Therapy For Hyperthyroidism  Result Date: 10/13/2020 CLINICAL DATA:  Hyperthyroidism toxic multinodular goiter. Depressed TSH ranging from 0.1-0.3. Currently 0.315. EXAM: RADIOACTIVE IODINE THERAPY FOR HYPERTHYROIDISM COMPARISON:  Thyroid uptake and scan 10/07/2020, thyroid ultrasound 09/14/2020 TECHNIQUE:  Radioactive iodine prescribed by Dr. Leonia Reeves. The risks and benefits of radioactive iodine therapy were discussed with the  patient in detail by Dr. Dwaine Gale . Alternative therapies were also mentioned. Radiation safety was discussed with the patient, including how to protect the general public from exposure. There were no barriers to communication. Written consent was obtained. The patient then received a capsule containing the radiopharmaceutical. The patient will follow-up with the referring physician. RADIOPHARMACEUTICALS:  30.9 mCi I-131 sodium iodide orally IMPRESSION: Per oral administration of I-131 sodium iodide for the treatment of toxic multinodular goiter. Electronically Signed   By: Suzy Bouchard M.D.   On: 10/13/2020 11:39     Assessment and plan- Patient is a 65 y.o. female who presents to pulmonary nodule clinic for follow-up of incidental lung nodules.  A telephone visit was conducted to review most recent CT scan results.    CT chest without contrast from 10/26/2020 showed tiny pulmonary nodules measuring up to 6 mm in size.  Noncontrast chest CT in 3 to 6 months is recommended.  If nodules are stable at that time recommend follow-up in 18 to 24 months.  Does not appear the radiologist compared the scan to his CT abdomen from [facility]  There is a 4 mm right lower lobe pulmonary nodule, 6 mm and 4 mm right lower lobe perifissural nodules likely lymph nodes and a 4 mm left lower lobe nodule.   Calculating malignancy probability of a pulmonary nodule: Risk factors include: 1.  Age. 2.  Cancer history. 3.  Diameter of pulmonary nodule and mm 4.  Location 5.  Smoking history 6.  Spiculation present   Based on risk factors, this patient is High  risk for the development of lung cancer .  I would recommend follow-up in approximately 6 months.  If stable at that time, would recommend referral to low-dose CT screening program.   Pulmonary nodules in the majority of cases are benign but the  probability of these becoming malignant cannot be undermined.  Early identification of malignant nodules could lead to early diagnosis and increased survival.   We discussed the probability of pulmonary nodules becoming malignant increase with age, pack years of tobacco use, size/characteristics of the nodule and location; with upper lobe involvement being most worrisome.   We discussed the goal of our clinic is to thoroughly evaluate each nodule, developed a comprehensive, individualized plan of care utilizing the most advanced technology and significantly reduce the time from detection to treatment.  A dedicated pulmonary nodule clinic has proven to indeed expedite the detection and treatment of lung cancer.   Patient education in fact sheet provided along with most recent CT scans.  Plan: Discussed recent CT scan and compared to CT abdomen from [facility] on 10/19/2019. Recommend follow-up in approximately 6 months to ensure stability of additional nodules. Discussed personal and family history. Discussed environmental, occupational and personal history of smoking. Discussed low-dose CT screening referral.  Imaging is stable at next visit.  Disposition: Recommend follow-up in 6 months with repeat CT chest. Follow-up in lung nodule clinic shortly after.    Visit Diagnosis 1. Lung nodule     Patient expressed understanding and was in agreement with this plan. She also understands that She can call clinic at any time with any questions, concerns, or complaints.   I provided 15 minutes of non face-to-face telephone visit time during this encounter, and > 50% was spent counseling as documented under my assessment & plan.  Thank you for allowing me to participate in the care of this very pleasant patient.    Jacquelin Hawking, NP [facility]  at [facility] Cell - [phone] Pager- [phone] 10/28/2020 10:54 AM

## 2020-11-24 DIAGNOSIS — E0520 Thyrotoxicosis with toxic multinodular goiter without thyrotoxic crisis or storm: Secondary | ICD-10-CM | POA: Diagnosis not present

## 2020-11-29 DIAGNOSIS — Z8781 Personal history of (healed) traumatic fracture: Secondary | ICD-10-CM | POA: Diagnosis not present

## 2020-11-29 DIAGNOSIS — E0520 Thyrotoxicosis with toxic multinodular goiter without thyrotoxic crisis or storm: Secondary | ICD-10-CM | POA: Diagnosis not present

## 2020-11-29 DIAGNOSIS — M810 Age-related osteoporosis without current pathological fracture: Secondary | ICD-10-CM | POA: Diagnosis not present

## 2021-01-05 DIAGNOSIS — M25562 Pain in left knee: Secondary | ICD-10-CM | POA: Diagnosis not present

## 2021-01-05 DIAGNOSIS — M79651 Pain in right thigh: Secondary | ICD-10-CM | POA: Diagnosis not present

## 2021-01-05 DIAGNOSIS — R293 Abnormal posture: Secondary | ICD-10-CM | POA: Diagnosis not present

## 2021-01-05 DIAGNOSIS — M25561 Pain in right knee: Secondary | ICD-10-CM | POA: Diagnosis not present

## 2021-01-05 DIAGNOSIS — M546 Pain in thoracic spine: Secondary | ICD-10-CM | POA: Diagnosis not present

## 2021-01-05 DIAGNOSIS — F17200 Nicotine dependence, unspecified, uncomplicated: Secondary | ICD-10-CM | POA: Diagnosis not present

## 2021-01-05 DIAGNOSIS — M6281 Muscle weakness (generalized): Secondary | ICD-10-CM | POA: Diagnosis not present

## 2021-01-05 DIAGNOSIS — M79605 Pain in left leg: Secondary | ICD-10-CM | POA: Diagnosis not present

## 2021-01-05 DIAGNOSIS — S32009A Unspecified fracture of unspecified lumbar vertebra, initial encounter for closed fracture: Secondary | ICD-10-CM | POA: Diagnosis not present

## 2021-01-05 DIAGNOSIS — M5412 Radiculopathy, cervical region: Secondary | ICD-10-CM | POA: Diagnosis not present

## 2021-01-05 DIAGNOSIS — M545 Low back pain, unspecified: Secondary | ICD-10-CM | POA: Diagnosis not present

## 2021-01-05 DIAGNOSIS — M797 Fibromyalgia: Secondary | ICD-10-CM | POA: Diagnosis not present

## 2021-01-30 DIAGNOSIS — E0520 Thyrotoxicosis with toxic multinodular goiter without thyrotoxic crisis or storm: Secondary | ICD-10-CM | POA: Diagnosis not present

## 2021-01-30 DIAGNOSIS — M810 Age-related osteoporosis without current pathological fracture: Secondary | ICD-10-CM | POA: Diagnosis not present

## 2021-01-30 DIAGNOSIS — Z8781 Personal history of (healed) traumatic fracture: Secondary | ICD-10-CM | POA: Diagnosis not present

## 2021-01-30 DIAGNOSIS — E890 Postprocedural hypothyroidism: Secondary | ICD-10-CM | POA: Diagnosis not present

## 2021-02-02 DIAGNOSIS — H2513 Age-related nuclear cataract, bilateral: Secondary | ICD-10-CM | POA: Diagnosis not present

## 2021-02-21 ENCOUNTER — Ambulatory Visit (INDEPENDENT_AMBULATORY_CARE_PROVIDER_SITE_OTHER): Payer: PPO | Admitting: Internal Medicine

## 2021-02-21 ENCOUNTER — Encounter: Payer: Self-pay | Admitting: Internal Medicine

## 2021-02-21 ENCOUNTER — Other Ambulatory Visit: Payer: Self-pay

## 2021-02-21 VITALS — BP 115/59 | HR 79 | Temp 97.5°F | Resp 20 | Ht 62.0 in | Wt 142.8 lb

## 2021-02-21 DIAGNOSIS — R5383 Other fatigue: Secondary | ICD-10-CM | POA: Diagnosis not present

## 2021-02-21 DIAGNOSIS — R0602 Shortness of breath: Secondary | ICD-10-CM

## 2021-02-21 DIAGNOSIS — R0989 Other specified symptoms and signs involving the circulatory and respiratory systems: Secondary | ICD-10-CM

## 2021-02-21 DIAGNOSIS — R059 Cough, unspecified: Secondary | ICD-10-CM | POA: Diagnosis not present

## 2021-02-21 DIAGNOSIS — R0982 Postnasal drip: Secondary | ICD-10-CM | POA: Diagnosis not present

## 2021-02-21 DIAGNOSIS — J029 Acute pharyngitis, unspecified: Secondary | ICD-10-CM

## 2021-02-21 LAB — POCT RAPID STREP A (OFFICE): Rapid Strep A Screen: NEGATIVE

## 2021-02-21 MED ORDER — KETOROLAC TROMETHAMINE 30 MG/ML IJ SOLN
30.0000 mg | Freq: Once | INTRAMUSCULAR | Status: AC
Start: 2021-02-21 — End: 2021-02-21
  Administered 2021-02-21: 30 mg via INTRAMUSCULAR

## 2021-02-21 NOTE — Progress Notes (Signed)
Subjective:    Patient ID: Amy Rangel, female    DOB: 1955-09-24, 65 y.o.   MRN: [id number]  HPI  Patient presents to clinic today with complaint of fatigue, postnasal drip, sore throat, cough and chest congestion.  This started 1 week ago.  She is not having any difficulty swallowing.  The cough is productive of clear mucus.  She does have some mild shortness of breath but denies chest pain or chest tightness.  She denies fever, chills, runny nose, nasal congestion, ear pain, nausea, vomiting or diarrhea.  She has not had sick contacts that she is aware of.  She denies COVID exposure.  She has not had her COVID-vaccine.  She has not taken any medications for this.  Review of Systems     Past Medical History:  Diagnosis Date   Cancer ([place])    skin ca    Current Outpatient Medications  Medication Sig Dispense Refill   butalbital-acetaminophen-caffeine (FIORICET) 50-325-40 MG tablet Take by mouth.     Calcium Carb-Cholecalciferol (CALCIUM 1000 + D PO) Take by mouth.     Cholecalciferol 50 MCG (2000 UT) TABS Take by mouth.     clonazePAM (KLONOPIN) 0.5 MG tablet Take 0.5 tablets (0.25 mg total) by mouth daily. 90 tablet 0   cyclobenzaprine (FLEXERIL) 5 MG tablet Take 5 mg by mouth 3 (three) times daily.     GNP GARLIC EXTRACT PO Take by mouth.     Magnesium Gluconate 550 MG TABS Take by mouth.     Omega 3 1000 MG CAPS Take by mouth.     Riboflavin (B-2-400 PO) Take by mouth.     traMADol (ULTRAM) 50 MG tablet Take 50 mg by mouth daily as needed.      Turmeric Curcumin 500 MG CAPS Take by mouth.     Zinc Sulfate 66 MG TABS Take by mouth.     No current facility-administered medications for this visit.    Allergies  Allergen Reactions   Bupropion Swelling    Lip swelling    Citrullus Vulgaris Other (See Comments), Rash and Swelling   Gabapentin Other (See Comments) and Shortness Of Breath    "Acid reflux and chest pressure"    Iodinated Diagnostic Agents Anaphylaxis     Uncoded Allergy. Allergen: ct contrast dye (with steroids), Other Reaction: THROAT TIGHTNING   Iodine Other (See Comments)    Other reaction(s): Other (See Comments) Other Reaction: ct contrast & xray dye Other Reaction: ct contrast & xray dye    Iopamidol Other (See Comments)    Other reaction(s): Other (See Comments) Other Reaction: other reaction Other Reaction: other reaction    Levetiracetam Other (See Comments) and Swelling    Facial swelling, upset stomach Swelling of face and felt drunk    Mangifera Indica Rash and Swelling   Neomycin-Polymyxin-Dexameth Rash and Swelling   Papaya Rash and Swelling   Prednisone Anaphylaxis   Sulfa Antibiotics Anaphylaxis and Other (See Comments)    Uncoded Allergy. Allergen: xray dye Uncoded Allergy. Allergen: ct contrast dye (with steroids), Other Reaction: THROAT TIGHTNING    Zolpidem Diarrhea   Teriparatide Other (See Comments)    Patient states that when she took this medication she had a sore throat and was very fatigue. Patient states that when she took this medication she had a sore throat and was very fatigue.    Aluminum Other (See Comments)    Growths Growths    Amoxicillin     Other reaction(s): Unknown Other  reaction(s): Unknown    Atorvastatin Diarrhea   Codeine Other (See Comments)    Other reaction(s): Other (See Comments) Other Reaction: NOT ASSESSED Other Reaction: NOT ASSESSED    Diclofenac Potassium Diarrhea   Duloxetine Diarrhea   Duloxetine Hcl     Other reaction(s): Other (See Comments) constipation   Erythromycin Swelling    Swelling of face    Escitalopram Other (See Comments)    "Insomnia" "Insomnia"    Eslicarbazepine Diarrhea and Other (See Comments)    Diarrhea, insomnia   Hepatitis B Vaccine Other (See Comments)    Chest pain and arm pain    Ibuprofen Other (See Comments)    "Stomach upset real bad" "Stomach upset real bad"    Ketoprofen Other (See Comments)    "Stomach upset really  bad"    Lanolin Other (See Comments)    Other reaction(s): Other (See Comments) Other Reaction: NOT ASSESSED Other Reaction: NOT ASSESSED    Metoclopramide     error   Midodrine Hcl Other (See Comments)    headache   Monosodium Glutamate     Other reaction(s): Unknown Other reaction(s): Unknown    Morphine Nausea And Vomiting    vomiting   Moxifloxacin Other (See Comments)    Other reaction(s): Other (See Comments) Other Reaction: OTHER REACTION Other Reaction: OTHER REACTION    Moxifloxacin Hcl    Naproxen    Naproxen Sodium Other (See Comments)    Bloody stools Bloody stool    Other     Client reports 35 allergies. Copy of allergies scanned. Garlan Fair RN, [facility] Department   Oxycodone    Oxycodone-Acetaminophen Other (See Comments)    Other reaction(s): Other (See Comments) Other Reaction: NOT ASSESSED Other Reaction: NOT ASSESSED    Sulfamethoxazole-Trimethoprim    Trimethoprim    Zonisamide Other (See Comments)    Bladder infection     Lamotrigine Rash   Neomycin-Polymyxin B Gu Rash   Sertraline Rash   Sertraline Hcl Rash   Valproic Acid Nausea Only    Family History  Problem Relation Age of Onset   Hypothyroidism Mother    Breast cancer Mother 38   Leukemia Father     Social History   Socioeconomic History   Marital status: Divorced    Spouse name: Not on file   Number of children: Not on file   Years of education: Not on file   Highest education level: Not on file  Occupational History   Not on file  Tobacco Use   Smoking status: Former    Types: Cigarettes    Quit date: 10/15/2018    Years since quitting: 2.3   Smokeless tobacco: Never  Vaping Use   Vaping Use: Never used  Substance and Sexual Activity   Alcohol use: Not Currently   Drug use: Not Currently   Sexual activity: Not on file  Other Topics Concern   Not on file  Social History Narrative   Not on file   Social Determinants of Health   Financial Resource  Strain: Not on file  Food Insecurity: Not on file  Transportation Needs: Not on file  Physical Activity: Not on file  Stress: Not on file  Social Connections: Not on file  Intimate Partner Violence: Not on file     Constitutional: Patient reports fatigue.  Denies fever, malaise, headache or abrupt weight changes.  HEENT: Patient reports sore throat, postnasal drip.  Denies eye pain, eye redness, ear pain, ringing in the ears, wax buildup,  runny nose, nasal congestion, bloody nose. Respiratory: Patient reports cough and shortness of breath.  Denies difficulty breathing.   Cardiovascular: Denies chest pain, chest tightness, palpitations or swelling in the hands or feet.  Gastrointestinal: Denies nausea, vomiting or diarrhea.  Skin: Denies redness, rashes, lesions or ulcercations.   No other specific complaints in a complete review of systems (except as listed in HPI above).  Objective:   Physical Exam   BP (!) 115/59 (BP Location: Right Arm, Patient Position: Sitting, Cuff Size: Normal)   Pulse 79   Temp (!) 97.5 F (36.4 C) (Temporal)   Resp 20   Ht '5\' 2"'$  (1.575 m)   Wt 142 lb 12.8 oz (64.8 kg)   SpO2 98%   BMI 26.12 kg/m   Wt Readings from Last 3 Encounters:  06/23/20 143 lb 12.8 oz (65.2 kg)  01/29/20 142 lb (64.4 kg)  11/17/19 141 lb 6.4 oz (64.1 kg)    General: Appears her stated age, in NAD. Skin: Warm, dry and intact. No rashes noted. HEENT: Head: normal shape and size; Eyes: sclera white, and EOMs intact; Throat/Mouth: Teeth present, mucosa erythematous and moist, no exudate, lesions or ulcerations noted.  Neck: Bilateral anterior cervical lymphadenopathy noted. Cardiovascular: Normal rate and rhythm.  Pulmonary/Chest: Normal effort and positive vesicular breath sounds. No respiratory distress. No wheezes, rales or ronchi noted.  Neurological: Alert and oriented.  Psychiatric: Mood and affect normal. Behavior is normal. Judgment and thought content normal.     BMET    Component Value Date/Time   NA 144 06/27/2020 0924   K 3.5 06/27/2020 0924   CL 105 06/27/2020 0924   CO2 30 06/27/2020 0924   GLUCOSE 92 06/27/2020 0924   BUN 14 06/27/2020 0924   CREATININE 0.74 06/27/2020 0924   CALCIUM 9.6 06/27/2020 0924   GFRNONAA 84 06/27/2020 0924   GFRAA 98 06/27/2020 0924    Lipid Panel     Component Value Date/Time   CHOL 233 (H) 11/17/2019 0938   TRIG 149 11/17/2019 0938   HDL 80 11/17/2019 0938   CHOLHDL 2.9 11/17/2019 0938   LDLCALC 127 (H) 11/17/2019 0938    CBC    Component Value Date/Time   WBC 5.4 06/27/2020 0924   RBC 4.55 06/27/2020 0924   HGB 13.5 06/27/2020 0924   HCT 40.2 06/27/2020 0924   PLT 293 06/27/2020 0924   MCV 88.4 06/27/2020 0924   MCH 29.7 06/27/2020 0924   MCHC 33.6 06/27/2020 0924   RDW 12.7 06/27/2020 0924   LYMPHSABS 1,917 06/27/2020 0924   EOSABS 108 06/27/2020 0924   BASOSABS 11 06/27/2020 0924    Hgb A1C No results found for: HGBA1C         Assessment & Plan:  Fatigue, Postnasal Drip, Sore Throat, Cough, Chest Congestion and Shortness of Breath:  Symptoms concerning for COVID infection She declines COVID swab today and given the fact that she has been having the symptoms for more than 5 days she would not qualify for oral antiviral therapy at this time anyway Rapid strep negative Toradol 30 mg IM x1 Recommend symptomatic care: Rest, fluids, ibuprofen and cough syrup OTC  Return precautions discussed   Webb Silversmith, NP This visit occurred during the SARS-CoV-2 public health emergency.  Safety protocols were in place, including screening questions prior to the visit, additional usage of staff PPE, and extensive cleaning of exam room while observing appropriate contact time as indicated for disinfecting solutions.

## 2021-02-21 NOTE — Patient Instructions (Signed)
Sore Throat When you have a sore throat, your throat may feel: Tender. Burning. Irritated. Scratchy. Painful when you swallow. Painful when you talk. Many things can cause a sore throat, such as: An infection. Allergies. Dry air. Smoke or pollution. Radiation treatment. Gastroesophageal reflux disease (GERD). A tumor. A sore throat can be the first sign of another sickness. It can happen with other problems, like: Coughing. Sneezing. Fever. Swelling in the neck. Most sore throats go away without treatment. Follow these instructions at home:     Take over-the-counter medicines only as told by your doctor. If your child has a sore throat, do not give your child aspirin. Drink enough fluids to keep your pee (urine) pale yellow. Rest when you feel you need to. To help with pain: Sip warm liquids, such as broth, herbal tea, or warm water. Eat or drink cold or frozen liquids, such as frozen ice pops. Gargle with a salt-water mixture 3-4 times a day or as needed. To make a salt-water mixture, add -1 tsp (3-6 g) of salt to 1 cup (237 mL) of warm water. Mix it until you cannot see the salt anymore. Suck on hard candy or throat lozenges. Put a cool-mist humidifier in your bedroom at night. Sit in the bathroom with the door closed for 5-10 minutes while you run hot water in the shower. Do not use any products that contain nicotine or tobacco, such as cigarettes, e-cigarettes, and chewing tobacco. If you need help quitting, ask your doctor. Wash your hands well and often with soap and water. If soap and water are not available, use hand sanitizer. Contact a doctor if: You have a fever for more than 2-3 days. You keep having symptoms for more than 2-3 days. Your throat does not get better in 7 days. You have a fever and your symptoms suddenly get worse. Your child who is 3 months to 64 years old has a temperature of 102.33F (39C) or higher. Get help right away if: You have trouble  breathing. You cannot swallow fluids, soft foods, or your saliva. You have swelling in your throat or neck that gets worse. You keep feeling sick to your stomach (nauseous). You keep throwing up (vomiting). Summary A sore throat is pain, burning, irritation, or scratchiness in the throat. Many things can cause a sore throat. Take over-the-counter medicines only as told by your doctor. Do not give your child aspirin. Drink plenty of fluids, and rest as needed. Contact a doctor if your symptoms get worse or your sore throat does not get better within 7 days. This information is not intended to replace advice given to you by your health care provider. Make sure you discuss any questions you have with your healthcare provider. Document Revised: 11/18/2017 Document Reviewed: 11/18/2017 Elsevier Patient Education  2022 [employer].

## 2021-02-23 DIAGNOSIS — H2512 Age-related nuclear cataract, left eye: Secondary | ICD-10-CM | POA: Diagnosis not present

## 2021-02-24 ENCOUNTER — Encounter: Payer: Self-pay | Admitting: Internal Medicine

## 2021-02-27 ENCOUNTER — Encounter: Payer: Self-pay | Admitting: Ophthalmology

## 2021-03-07 ENCOUNTER — Ambulatory Visit: Payer: PPO | Admitting: Anesthesiology

## 2021-03-07 ENCOUNTER — Encounter
Admission: RE | Disposition: A | Discharge status: Home / Self Care | Payer: Self-pay | Source: Home / Self Care | Attending: Ophthalmology

## 2021-03-07 ENCOUNTER — Encounter: Payer: Self-pay | Admitting: Ophthalmology

## 2021-03-07 ENCOUNTER — Other Ambulatory Visit: Payer: Self-pay

## 2021-03-07 ENCOUNTER — Ambulatory Visit
Admission: RE | Admit: 2021-03-07 | Discharge: 2021-03-07 | Disposition: A | Discharge status: Home / Self Care | Payer: PPO | Attending: Ophthalmology | Admitting: Ophthalmology

## 2021-03-07 DIAGNOSIS — Z880 Allergy status to penicillin: Secondary | ICD-10-CM | POA: Diagnosis not present

## 2021-03-07 DIAGNOSIS — Z79899 Other long term (current) drug therapy: Secondary | ICD-10-CM | POA: Insufficient documentation

## 2021-03-07 DIAGNOSIS — Z882 Allergy status to sulfonamides status: Secondary | ICD-10-CM | POA: Insufficient documentation

## 2021-03-07 DIAGNOSIS — Z881 Allergy status to other antibiotic agents status: Secondary | ICD-10-CM | POA: Diagnosis not present

## 2021-03-07 DIAGNOSIS — Z85828 Personal history of other malignant neoplasm of skin: Secondary | ICD-10-CM | POA: Insufficient documentation

## 2021-03-07 DIAGNOSIS — Z888 Allergy status to other drugs, medicaments and biological substances status: Secondary | ICD-10-CM | POA: Diagnosis not present

## 2021-03-07 DIAGNOSIS — Z885 Allergy status to narcotic agent status: Secondary | ICD-10-CM | POA: Insufficient documentation

## 2021-03-07 DIAGNOSIS — Z8673 Personal history of transient ischemic attack (TIA), and cerebral infarction without residual deficits: Secondary | ICD-10-CM | POA: Insufficient documentation

## 2021-03-07 DIAGNOSIS — Z79890 Hormone replacement therapy (postmenopausal): Secondary | ICD-10-CM | POA: Insufficient documentation

## 2021-03-07 DIAGNOSIS — H2512 Age-related nuclear cataract, left eye: Secondary | ICD-10-CM | POA: Insufficient documentation

## 2021-03-07 DIAGNOSIS — Z87891 Personal history of nicotine dependence: Secondary | ICD-10-CM | POA: Insufficient documentation

## 2021-03-07 DIAGNOSIS — H25812 Combined forms of age-related cataract, left eye: Secondary | ICD-10-CM | POA: Diagnosis not present

## 2021-03-07 HISTORY — DX: Migraine, unspecified, not intractable, without status migrainosus: G43.909

## 2021-03-07 HISTORY — DX: Occlusion and stenosis of unspecified carotid artery: I65.29

## 2021-03-07 HISTORY — DX: Motion sickness, initial encounter: T75.3XXA

## 2021-03-07 HISTORY — DX: Fibromyalgia: M79.7

## 2021-03-07 HISTORY — PX: CATARACT EXTRACTION W/PHACO: SHX586

## 2021-03-07 HISTORY — DX: Other complications of anesthesia, initial encounter: T88.59XA

## 2021-03-07 HISTORY — DX: Hypothyroidism, unspecified: E03.9

## 2021-03-07 HISTORY — DX: Transient cerebral ischemic attack, unspecified: G45.9

## 2021-03-07 SURGERY — PHACOEMULSIFICATION, CATARACT, WITH IOL INSERTION
Anesthesia: Topical | Site: Eye | Laterality: Left

## 2021-03-07 MED ORDER — POLYMYXIN B-TRIMETHOPRIM 10000-0.1 UNIT/ML-% OP SOLN
OPHTHALMIC | Status: DC | PRN
  Administered 2021-03-07: 1 [drp] via OPHTHALMIC

## 2021-03-07 MED ORDER — MIDAZOLAM HCL 2 MG/2ML IJ SOLN
INTRAMUSCULAR | Status: DC | PRN
  Administered 2021-03-07: .5 mg via INTRAVENOUS
  Administered 2021-03-07: 1 mg via INTRAVENOUS

## 2021-03-07 MED ORDER — FENTANYL CITRATE (PF) 100 MCG/2ML IJ SOLN
INTRAMUSCULAR | Status: DC | PRN
  Administered 2021-03-07: 50 ug via INTRAVENOUS

## 2021-03-07 MED ORDER — SIGHTPATH DOSE#1 BSS IO SOLN
INTRAOCULAR | Status: DC | PRN
  Administered 2021-03-07: 76 mL via OPHTHALMIC

## 2021-03-07 MED ORDER — SIGHTPATH DOSE#1 BSS IO SOLN
INTRAOCULAR | Status: DC | PRN
  Administered 2021-03-07: 15 mL

## 2021-03-07 MED ORDER — TETRACAINE HCL 0.5 % OP SOLN
1.0000 [drp] | OPHTHALMIC | Status: DC | PRN
  Administered 2021-03-07 (×3): 1 [drp] via OPHTHALMIC

## 2021-03-07 MED ORDER — SIGHTPATH DOSE#1 NA CHONDROIT SULF-NA HYALURON 40-17 MG/ML IO SOLN
INTRAOCULAR | Status: DC | PRN
  Administered 2021-03-07: 1 mL via INTRAOCULAR

## 2021-03-07 MED ORDER — LACTATED RINGERS IV SOLN: INTRAVENOUS | Status: DC

## 2021-03-07 MED ORDER — CYCLOPENTOLATE HCL 2 % OP SOLN
1.0000 [drp] | OPHTHALMIC | Status: AC
  Administered 2021-03-07 (×3): 1 [drp] via OPHTHALMIC

## 2021-03-07 MED ORDER — MOXIFLOXACIN HCL 0.5 % OP SOLN: OPHTHALMIC | Status: DC | PRN

## 2021-03-07 MED ORDER — CEFUROXIME OPHTHALMIC INJECTION 1 MG/0.1 ML
INJECTION | OPHTHALMIC | Status: DC | PRN
  Administered 2021-03-07: 0.1 mL via INTRACAMERAL

## 2021-03-07 MED ORDER — PHENYLEPHRINE HCL 10 % OP SOLN
1.0000 [drp] | OPHTHALMIC | Status: AC
  Administered 2021-03-07 (×3): 1 [drp] via OPHTHALMIC

## 2021-03-07 MED ORDER — BRIMONIDINE TARTRATE-TIMOLOL 0.2-0.5 % OP SOLN: OPHTHALMIC | Status: DC | PRN

## 2021-03-07 MED ORDER — SIGHTPATH DOSE#1 BSS IO SOLN
INTRAOCULAR | Status: DC | PRN
  Administered 2021-03-07: 1 mL

## 2021-03-07 SURGICAL SUPPLY — 16 items
CANNULA ANT/CHMB 27GA (MISCELLANEOUS) ×4 IMPLANT
GLOVE SURG ENC TEXT LTX SZ8 (GLOVE) ×2 IMPLANT
GLOVE SURG TRIUMPH 8.0 PF LTX (GLOVE) ×2 IMPLANT
GOWN STRL REUS W/ TWL LRG LVL3 (GOWN DISPOSABLE) ×2 IMPLANT
GOWN STRL REUS W/TWL LRG LVL3 (GOWN DISPOSABLE) ×4
LENS IOL TECNIS EYHANCE 23.5 (Intraocular Lens) ×2 IMPLANT
MARKER SKIN DUAL TIP RULER LAB (MISCELLANEOUS) ×2 IMPLANT
NEEDLE FILTER BLUNT 18X 1/2SAF (NEEDLE) ×1
NEEDLE FILTER BLUNT 18X1 1/2 (NEEDLE) ×1 IMPLANT
PACK EYE AFTER SURG (MISCELLANEOUS) ×2 IMPLANT
SUT ETHILON 10-0 CS-B-6CS-B-6 (SUTURE)
SUTURE EHLN 10-0 CS-B-6CS-B-6 (SUTURE) IMPLANT
SYR 3ML LL SCALE MARK (SYRINGE) ×2 IMPLANT
SYR TB 1ML LUER SLIP (SYRINGE) ×2 IMPLANT
WATER STERILE IRR 250ML POUR (IV SOLUTION) ×2 IMPLANT
WIPE NON LINTING 3.25X3.25 (MISCELLANEOUS) ×2 IMPLANT

## 2021-03-07 NOTE — H&P (Signed)
[name]   Primary Care Physician:  Verl Bangs, FNP Ophthalmologist: Dr. George Ina  Pre-Procedure History & Physical: HPI:  Amy Rangel is a 65 y.o. female here for cataract surgery.   Past Medical History:  Diagnosis Date   Cancer ([place])    skin ca   Complication of anesthesia    Slow to wake, gets the "shakes"   Fibromyalgia    Hypothyroidism    ICAO (internal carotid artery occlusion)    left   Migraine headache    approx 4-5x/yr   Motion sickness    boats   TIA (transient ischemic attack)    No deficits    Past Surgical History:  Procedure Laterality Date   ABDOMINAL HYSTERECTOMY     BREAST BIOPSY Left 1995   neg   BREAST SURGERY Left 1993   Left lumpectomy/partial mastectomy   foot surgery Right    Right foot ganglion cyst removal   TONSILLECTOMY      Prior to Admission medications   Medication Sig Start Date End Date Taking? Authorizing Provider  ARMOUR THYROID 30 MG tablet Take 30 mg by mouth daily. 02/10/21  Yes [provider]  butalbital-acetaminophen-caffeine (FIORICET) 50-325-40 MG tablet Take by mouth. 04/08/19  Yes [provider]  Cholecalciferol 50 MCG (2000 UT) TABS Take by mouth.   Yes [provider]  clonazePAM (KLONOPIN) 0.5 MG tablet Take 0.5 tablets (0.25 mg total) by mouth daily. Patient taking differently: Take 0.25 mg by mouth daily as needed. 04/21/20  Yes Malfi, Lupita Raider, FNP  cyclobenzaprine (FLEXERIL) 5 MG tablet Take 5 mg by mouth 3 (three) times daily. 01/19/20  Yes [provider]  GNP GARLIC EXTRACT PO Take by mouth.   Yes [provider]  Magnesium Gluconate 550 MG TABS Take by mouth.   Yes [provider]  Omega 3 1000 MG CAPS Take by mouth.   Yes [provider]  Riboflavin (B-2-400 PO) Take by mouth.   Yes [provider]  traMADol (ULTRAM) 50 MG tablet Take 50 mg by mouth daily as needed.  10/12/19  Yes [provider]  Turmeric  Curcumin 500 MG CAPS Take by mouth.   Yes [provider]  Zinc Sulfate 66 MG TABS Take by mouth.   Yes [provider]    Allergies as of 02/09/2021 - Review Complete 10/13/2020  Allergen Reaction Noted   Bupropion Swelling 12/25/2012   Citrullus vulgaris Other (See Comments), Rash, and Swelling 08/31/2019   Gabapentin Other (See Comments) and Shortness Of Breath 09/06/2014   Iodinated diagnostic agents Anaphylaxis 02/05/2014   Iodine Other (See Comments) 02/05/2014   Iopamidol Other (See Comments) 02/05/2014   Levetiracetam Other (See Comments) and Swelling 08/26/2014   Mangifera indica Rash and Swelling 08/31/2019   Neomycin-polymyxin-dexameth Rash and Swelling 08/31/2019   Papaya Rash and Swelling 08/31/2019   Prednisone Anaphylaxis 10/17/2016   Sulfa antibiotics Anaphylaxis and Other (See Comments) 10/15/2019   Zolpidem Diarrhea 11/30/2015   Teriparatide Other (See Comments) 08/23/2015   Aluminum Other (See Comments) 11/30/2015   Amoxicillin  12/25/2012   Atorvastatin Diarrhea 04/07/2018   Codeine Other (See Comments) 12/25/2012   Diclofenac potassium Diarrhea 02/05/2014   Duloxetine Diarrhea 11/16/2015   Duloxetine hcl  12/25/2012   Erythromycin Swelling 12/25/2012   Escitalopram Other (See Comments) [id number]   Eslicarbazepine Diarrhea and Other (See Comments) 08/31/2019   Hepatitis b vaccine Other (See Comments) 12/25/2012   Ibuprofen Other (See Comments) 11/30/2015   Ketoprofen Other (  See Comments) 12/25/2012   Lanolin Other (See Comments) 12/25/2012   Metoclopramide  08/31/2019   Midodrine hcl Other (See Comments) 12/25/2012   Monosodium glutamate  02/05/2014   Morphine Nausea And Vomiting 08/26/2014   Moxifloxacin Other (See Comments) 02/05/2014   Moxifloxacin hcl  12/25/2012   Naproxen  12/25/2012   Naproxen sodium Other (See Comments) 08/26/2014   Other  01/05/2020   Oxycodone  12/25/2012   Oxycodone-acetaminophen Other (See Comments)  02/05/2014   Sulfamethoxazole-trimethoprim  12/25/2012   Trimethoprim  01/05/2013   Zonisamide Other (See Comments) 12/25/2012   Lamotrigine Rash 08/26/2014   Neomycin-polymyxin b gu Rash 11/12/2017   Sertraline Rash 08/26/2014   Sertraline hcl Rash 12/25/2012   Valproic acid Nausea Only 08/31/2019    Family History  Problem Relation Age of Onset   Hypothyroidism Mother    Breast cancer Mother 66   Leukemia Father     Social History   Socioeconomic History   Marital status: Divorced    Spouse name: Not on file   Number of children: Not on file   Years of education: Not on file   Highest education level: Not on file  Occupational History   Not on file  Tobacco Use   Smoking status: Former    Types: Cigarettes    Quit date: 10/15/2018    Years since quitting: 2.3   Smokeless tobacco: Never  Vaping Use   Vaping Use: Never used  Substance and Sexual Activity   Alcohol use: Not Currently   Drug use: Not Currently   Sexual activity: Not on file  Other Topics Concern   Not on file  Social History Narrative   Not on file   Social Determinants of Health   Financial Resource Strain: Not on file  Food Insecurity: Not on file  Transportation Needs: Not on file  Physical Activity: Not on file  Stress: Not on file  Social Connections: Not on file  Intimate Partner Violence: Not on file    Review of Systems: See HPI, otherwise negative ROS  Physical Exam: BP (!) 124/56   Pulse 62   Temp (!) 97.3 F (36.3 C) (Temporal)   Ht '5\' 2"'$  (1.575 m)   Wt 65.3 kg   SpO2 98%   BMI 26.34 kg/m  General:   Alert, cooperative in NAD Head:  Normocephalic and atraumatic. Respiratory:  Normal work of breathing. Cardiovascular:  RRR  Impression/Plan: Amy Rangel is here for cataract surgery.  Risks, benefits, limitations, and alternatives regarding cataract surgery have been reviewed with the patient.  Questions have been answered.  All parties agreeable.   Birder Robson, MD  03/07/2021, 12:04 PM

## 2021-03-07 NOTE — Anesthesia Postprocedure Evaluation (Signed)
Anesthesia Post Note  Patient: Amy Rangel  Procedure(s) Performed: CATARACT EXTRACTION PHACO AND INTRAOCULAR LENS PLACEMENT (IOC) LEFT (Left: Eye)     Patient location during evaluation: PACU Level of consciousness: awake and alert Pain management: pain level controlled Vital Signs Assessment: post-procedure vital signs reviewed and stable Respiratory status: spontaneous breathing Cardiovascular status: stable Anesthetic complications: no   No notable events documented.  Gillian Scarce

## 2021-03-07 NOTE — Transfer of Care (Signed)
Immediate Anesthesia Transfer of Care Note  Patient: Amy Rangel  Procedure(s) Performed: CATARACT EXTRACTION PHACO AND INTRAOCULAR LENS PLACEMENT (IOC) LEFT (Left: Eye)  Patient Location: PACU  Anesthesia Type: No value filed.  Level of Consciousness: awake, alert  and patient cooperative  Airway and Oxygen Therapy: Patient Spontanous Breathing and Patient connected to supplemental oxygen  Post-op Assessment: Post-op Vital signs reviewed, Patient's Cardiovascular Status Stable, Respiratory Function Stable, Patent Airway and No signs of Nausea or vomiting  Post-op Vital Signs: Reviewed and stable  Complications: No notable events documented.

## 2021-03-07 NOTE — Anesthesia Preprocedure Evaluation (Signed)
Anesthesia Evaluation  Patient identified by MRN, date of birth, ID band Patient awake    Reviewed: Allergy & Precautions, H&P , NPO status , Patient's Chart, lab work & pertinent test results  Airway Mallampati: II  TM Distance: >3 FB Neck ROM: full    Dental no notable dental hx.    Pulmonary neg pulmonary ROS, former smoker,    Pulmonary exam normal        Cardiovascular negative cardio ROS Normal cardiovascular exam Rhythm:regular Rate:Normal     Neuro/Psych  Headaches,    GI/Hepatic negative GI ROS, Neg liver ROS,   Endo/Other  Hypothyroidism   Renal/GU      Musculoskeletal   Abdominal   Peds  Hematology negative hematology ROS (+)   Anesthesia Other Findings   Reproductive/Obstetrics                             Anesthesia Physical Anesthesia Plan  ASA: 2  Anesthesia Plan:    Post-op Pain Management:    Induction:   PONV Risk Score and Plan: 2 and Midazolam and TIVA  Airway Management Planned:   Additional Equipment:   Intra-op Plan:   Post-operative Plan:   Informed Consent: I have reviewed the patients History and Physical, chart, labs and discussed the procedure including the risks, benefits and alternatives for the proposed anesthesia with the patient or authorized representative who has indicated his/her understanding and acceptance.       Plan Discussed with:   Anesthesia Plan Comments:         Anesthesia Quick Evaluation

## 2021-03-07 NOTE — Op Note (Signed)
PREOPERATIVE DIAGNOSIS:  Nuclear sclerotic cataract of the left eye.   POSTOPERATIVE DIAGNOSIS:  Cataract   OPERATIVE PROCEDURE:  Procedure(s): CATARACT EXTRACTION PHACO AND INTRAOCULAR LENS PLACEMENT (IOC) LEFT   SURGEON:  Birder Robson, MD.   ANESTHESIA:   Anesthesiologist: Elgie Collard, MD CRNA: Dionne Bucy, CRNA  1.      Managed anesthesia care. 2.      Topical tetracaine drops followed by 2% Xylocaine jelly applied in the preoperative holding area.   COMPLICATIONS:  None.   TECHNIQUE:   Stop and chop   DESCRIPTION OF PROCEDURE:  The patient was examined and consented in the preoperative holding area where the aforementioned topical anesthesia was applied to the left eye and then brought back to the Operating Room where the left eye was prepped and draped in the usual sterile ophthalmic fashion and a lid speculum was placed. A paracentesis was created with the side port blade and the anterior chamber was filled with viscoelastic. A near clear corneal incision was performed with the steel keratome. A continuous curvilinear capsulorrhexis was performed with a cystotome followed by the capsulorrhexis forceps. Hydrodissection and hydrodelineation were carried out with BSS on a blunt cannula. The lens was removed in a stop and chop  technique and the remaining cortical material was removed with the irrigation-aspiration handpiece. The capsular bag was inflated with viscoelastic and the Technis [id number] lens was placed in the capsular bag without complication. The remaining viscoelastic was removed from the eye with the irrigation-aspiration handpiece. The wounds were hydrated. The anterior chamber was flushed with BSS and the eye was inflated to physiologic pressure. 0.1 mL of cefuroxime concentration 10 mg/mL was placed in the anterior chamber. The wounds were found to be water tight. The eye was dressed with Combigan. The patient was given protective glasses to wear throughout the day  and a shield with which to sleep tonight. The patient was also given drops with which to begin a drop regimen today and will follow-up with me in one day. Implant Name Type Inv. Item Serial No. Manufacturer Lot No. LRB No. Used Action  LENS IOL TECNIS EYHANCE 23.5 - [id number] Intraocular Lens LENS IOL TECNIS EYHANCE 23.5 [id number]   Left 1 Implanted   Procedure(s) with comments: CATARACT EXTRACTION PHACO AND INTRAOCULAR LENS PLACEMENT (IOC) LEFT (Left) - 11.77 1:23.9  Electronically signed: Birder Robson 03/07/2021 12:31 PM

## 2021-03-07 NOTE — Discharge Instructions (Signed)

## 2021-03-07 NOTE — Anesthesia Procedure Notes (Signed)
Procedure Name: MAC Date/Time: 03/07/2021 12:14 PM Performed by: Dionne Bucy, CRNA Pre-anesthesia Checklist: Patient identified, Emergency Drugs available, Suction available, Patient being monitored and Timeout performed Patient Re-evaluated:Patient Re-evaluated prior to induction Oxygen Delivery Method: Nasal cannula Placement Confirmation: positive ETCO2

## 2021-03-08 ENCOUNTER — Encounter: Payer: Self-pay | Admitting: Ophthalmology

## 2021-03-13 DIAGNOSIS — H2511 Age-related nuclear cataract, right eye: Secondary | ICD-10-CM | POA: Diagnosis not present

## 2021-03-20 NOTE — Discharge Instructions (Signed)

## 2021-03-21 ENCOUNTER — Encounter: Payer: Self-pay | Admitting: Ophthalmology

## 2021-03-21 ENCOUNTER — Ambulatory Visit
Admission: RE | Admit: 2021-03-21 | Discharge: 2021-03-21 | Disposition: A | Discharge status: Home / Self Care | Payer: PPO | Attending: Ophthalmology | Admitting: Ophthalmology

## 2021-03-21 ENCOUNTER — Ambulatory Visit: Payer: PPO | Admitting: Anesthesiology

## 2021-03-21 ENCOUNTER — Other Ambulatory Visit: Payer: Self-pay

## 2021-03-21 ENCOUNTER — Encounter
Admission: RE | Disposition: A | Discharge status: Home / Self Care | Payer: Self-pay | Source: Home / Self Care | Attending: Ophthalmology

## 2021-03-21 DIAGNOSIS — Z881 Allergy status to other antibiotic agents status: Secondary | ICD-10-CM | POA: Diagnosis not present

## 2021-03-21 DIAGNOSIS — H2511 Age-related nuclear cataract, right eye: Secondary | ICD-10-CM | POA: Diagnosis not present

## 2021-03-21 DIAGNOSIS — Z888 Allergy status to other drugs, medicaments and biological substances status: Secondary | ICD-10-CM | POA: Diagnosis not present

## 2021-03-21 DIAGNOSIS — Z882 Allergy status to sulfonamides status: Secondary | ICD-10-CM | POA: Insufficient documentation

## 2021-03-21 DIAGNOSIS — Z886 Allergy status to analgesic agent status: Secondary | ICD-10-CM | POA: Diagnosis not present

## 2021-03-21 DIAGNOSIS — H25811 Combined forms of age-related cataract, right eye: Secondary | ICD-10-CM | POA: Diagnosis not present

## 2021-03-21 DIAGNOSIS — Z880 Allergy status to penicillin: Secondary | ICD-10-CM | POA: Insufficient documentation

## 2021-03-21 DIAGNOSIS — Z8673 Personal history of transient ischemic attack (TIA), and cerebral infarction without residual deficits: Secondary | ICD-10-CM | POA: Insufficient documentation

## 2021-03-21 DIAGNOSIS — Z79899 Other long term (current) drug therapy: Secondary | ICD-10-CM | POA: Diagnosis not present

## 2021-03-21 DIAGNOSIS — Z87891 Personal history of nicotine dependence: Secondary | ICD-10-CM | POA: Diagnosis not present

## 2021-03-21 DIAGNOSIS — Z885 Allergy status to narcotic agent status: Secondary | ICD-10-CM | POA: Diagnosis not present

## 2021-03-21 HISTORY — PX: CATARACT EXTRACTION W/PHACO: SHX586

## 2021-03-21 SURGERY — PHACOEMULSIFICATION, CATARACT, WITH IOL INSERTION
Anesthesia: Monitor Anesthesia Care | Site: Eye | Laterality: Right

## 2021-03-21 MED ORDER — FENTANYL CITRATE (PF) 100 MCG/2ML IJ SOLN
INTRAMUSCULAR | Status: DC | PRN
  Administered 2021-03-21 (×2): 50 ug via INTRAVENOUS

## 2021-03-21 MED ORDER — ACETAMINOPHEN 325 MG PO TABS: 325.0000 mg | ORAL_TABLET | Freq: Once | ORAL | Status: DC

## 2021-03-21 MED ORDER — MIDAZOLAM HCL 2 MG/2ML IJ SOLN
INTRAMUSCULAR | Status: DC | PRN
  Administered 2021-03-21: 2 mg via INTRAVENOUS

## 2021-03-21 MED ORDER — SIGHTPATH DOSE#1 NA CHONDROIT SULF-NA HYALURON 40-17 MG/ML IO SOLN
INTRAOCULAR | Status: DC | PRN
  Administered 2021-03-21: 1 mL via INTRAOCULAR

## 2021-03-21 MED ORDER — LACTATED RINGERS IV SOLN: INTRAVENOUS | Status: DC

## 2021-03-21 MED ORDER — ACETAMINOPHEN 160 MG/5ML PO SOLN: 325.0000 mg | Freq: Once | ORAL | Status: DC

## 2021-03-21 MED ORDER — SIGHTPATH DOSE#1 BSS IO SOLN
INTRAOCULAR | Status: DC | PRN
  Administered 2021-03-21: 15 mL

## 2021-03-21 MED ORDER — CEFUROXIME OPHTHALMIC INJECTION 1 MG/0.1 ML
INJECTION | OPHTHALMIC | Status: DC | PRN
  Administered 2021-03-21: 0.1 mL via INTRACAMERAL

## 2021-03-21 MED ORDER — SIGHTPATH DOSE#1 BSS IO SOLN
INTRAOCULAR | Status: DC | PRN
  Administered 2021-03-21: 64 mL via OPHTHALMIC

## 2021-03-21 MED ORDER — POLYMYXIN B-TRIMETHOPRIM 10000-0.1 UNIT/ML-% OP SOLN
OPHTHALMIC | Status: DC | PRN
  Administered 2021-03-21: 1 [drp] via OPHTHALMIC

## 2021-03-21 MED ORDER — TETRACAINE HCL 0.5 % OP SOLN
1.0000 [drp] | OPHTHALMIC | Status: DC | PRN
  Administered 2021-03-21 (×3): 1 [drp] via OPHTHALMIC

## 2021-03-21 MED ORDER — SIGHTPATH DOSE#1 BSS IO SOLN
INTRAOCULAR | Status: DC | PRN
  Administered 2021-03-21: 1 mL via INTRAMUSCULAR

## 2021-03-21 MED ORDER — ARMC OPHTHALMIC DILATING DROPS
1.0000 "application " | OPHTHALMIC | Status: DC | PRN
  Administered 2021-03-21 (×3): 1 via OPHTHALMIC

## 2021-03-21 SURGICAL SUPPLY — 14 items
CANNULA ANT/CHMB 27GA (MISCELLANEOUS) ×4 IMPLANT
GLOVE SURG ENC TEXT LTX SZ8 (GLOVE) ×2 IMPLANT
GLOVE SURG TRIUMPH 8.0 PF LTX (GLOVE) ×2 IMPLANT
GOWN STRL REUS W/ TWL LRG LVL3 (GOWN DISPOSABLE) ×2 IMPLANT
GOWN STRL REUS W/TWL LRG LVL3 (GOWN DISPOSABLE) ×4
LENS IOL TECNIS EYHANCE 23.0 (Intraocular Lens) ×2 IMPLANT
MARKER SKIN DUAL TIP RULER LAB (MISCELLANEOUS) ×2 IMPLANT
NEEDLE FILTER BLUNT 18X 1/2SAF (NEEDLE) ×1
NEEDLE FILTER BLUNT 18X1 1/2 (NEEDLE) ×1 IMPLANT
PACK EYE AFTER SURG (MISCELLANEOUS) ×2 IMPLANT
SYR 3ML LL SCALE MARK (SYRINGE) ×2 IMPLANT
SYR TB 1ML LUER SLIP (SYRINGE) ×2 IMPLANT
WATER STERILE IRR 250ML POUR (IV SOLUTION) ×2 IMPLANT
WIPE NON LINTING 3.25X3.25 (MISCELLANEOUS) ×2 IMPLANT

## 2021-03-21 NOTE — Anesthesia Postprocedure Evaluation (Signed)
Anesthesia Post Note  Patient: Amy Rangel  Procedure(s) Performed: CATARACT EXTRACTION PHACO AND INTRAOCULAR LENS PLACEMENT (IOC) RIGHT 6.40 00:42.1 (Right: Eye)     Patient location during evaluation: PACU Anesthesia Type: MAC Level of consciousness: awake and alert and oriented Pain management: satisfactory to patient Vital Signs Assessment: post-procedure vital signs reviewed and stable Respiratory status: spontaneous breathing, nonlabored ventilation and respiratory function stable Cardiovascular status: blood pressure returned to baseline and stable Postop Assessment: Adequate PO intake and No signs of nausea or vomiting Anesthetic complications: no   No notable events documented.  Raliegh Ip

## 2021-03-21 NOTE — Op Note (Signed)
PREOPERATIVE DIAGNOSIS:  Nuclear sclerotic cataract of the right eye.   POSTOPERATIVE DIAGNOSIS:  H25.11 Cataract   OPERATIVE PROCEDURE:ORPROCALL@   SURGEON:  Birder Robson, MD.   ANESTHESIA:  Anesthesiologist: Ronelle Nigh, MD CRNA: Jeannene Patella, CRNA  1.      Managed anesthesia care. 2.      0.60ml of Shugarcaine was instilled in the eye following the paracentesis.   COMPLICATIONS:  None.   TECHNIQUE:   Stop and chop   DESCRIPTION OF PROCEDURE:  The patient was examined and consented in the preoperative holding area where the aforementioned topical anesthesia was applied to the right eye and then brought back to the Operating Room where the right eye was prepped and draped in the usual sterile ophthalmic fashion and a lid speculum was placed. A paracentesis was created with the side port blade and the anterior chamber was filled with viscoelastic. A near clear corneal incision was performed with the steel keratome. A continuous curvilinear capsulorrhexis was performed with a cystotome followed by the capsulorrhexis forceps. Hydrodissection and hydrodelineation were carried out with BSS on a blunt cannula. The lens was removed in a stop and chop  technique and the remaining cortical material was removed with the irrigation-aspiration handpiece. The capsular bag was inflated with viscoelastic and the Technis [id number]  lens was placed in the capsular bag without complication. The remaining viscoelastic was removed from the eye with the irrigation-aspiration handpiece. The wounds were hydrated. The anterior chamber was flushed with BSS and the eye was inflated to physiologic pressure. 0.43ml of Cefuroxime was placed in the anterior chamber. The wounds were found to be water tight. The patient was given protective glasses to wear throughout the day and a shield with which to sleep tonight. The patient was also given drops with which to begin a drop regimen today and will follow-up with me in  one day. Implant Name Type Inv. Item Serial No. Manufacturer Lot No. LRB No. Used Action  LENS IOL TECNIS EYHANCE 23.0 - [id number] Intraocular Lens LENS IOL TECNIS EYHANCE 23.0 [id number] JOHNSON   Right 1 Implanted   Procedure(s): CATARACT EXTRACTION PHACO AND INTRAOCULAR LENS PLACEMENT (IOC) RIGHT 6.40 00:42.1 (Right)  Electronically signed: Birder Robson 03/21/2021 10:33 AM

## 2021-03-21 NOTE — Anesthesia Procedure Notes (Signed)
Procedure Name: MAC Date/Time: 03/21/2021 10:18 AM Performed by: Jeannene Patella, CRNA Pre-anesthesia Checklist: Patient identified, Emergency Drugs available, Suction available, Timeout performed and Patient being monitored Patient Re-evaluated:Patient Re-evaluated prior to induction Oxygen Delivery Method: Nasal cannula Placement Confirmation: positive ETCO2

## 2021-03-21 NOTE — Anesthesia Preprocedure Evaluation (Signed)
Anesthesia Evaluation  Patient identified by MRN, date of birth, ID band Patient awake    Reviewed: Allergy & Precautions, H&P , NPO status , Patient's Chart, lab work & pertinent test results  Airway Mallampati: II  TM Distance: >3 FB Neck ROM: full    Dental no notable dental hx.    Pulmonary neg pulmonary ROS, former smoker,    Pulmonary exam normal breath sounds clear to auscultation       Cardiovascular negative cardio ROS Normal cardiovascular exam Rhythm:regular Rate:Normal     Neuro/Psych  Headaches, Anxiety TIA   GI/Hepatic negative GI ROS, Neg liver ROS,   Endo/Other  Hypothyroidism   Renal/GU      Musculoskeletal   Abdominal   Peds  Hematology negative hematology ROS (+)   Anesthesia Other Findings   Reproductive/Obstetrics                             Anesthesia Physical  Anesthesia Plan  ASA: 2  Anesthesia Plan: MAC   Post-op Pain Management:    Induction:   PONV Risk Score and Plan: 2 and Treatment may vary due to age or medical condition, TIVA and Midazolam  Airway Management Planned:   Additional Equipment:   Intra-op Plan:   Post-operative Plan:   Informed Consent: I have reviewed the patients History and Physical, chart, labs and discussed the procedure including the risks, benefits and alternatives for the proposed anesthesia with the patient or authorized representative who has indicated his/her understanding and acceptance.     Dental Advisory Given  Plan Discussed with: CRNA  Anesthesia Plan Comments:         Anesthesia Quick Evaluation

## 2021-03-21 NOTE — Transfer of Care (Signed)
Immediate Anesthesia Transfer of Care Note  Patient: Amy Rangel  Procedure(s) Performed: CATARACT EXTRACTION PHACO AND INTRAOCULAR LENS PLACEMENT (IOC) RIGHT 6.40 00:42.1 (Right: Eye)  Patient Location: PACU  Anesthesia Type: MAC  Level of Consciousness: awake, alert  and patient cooperative  Airway and Oxygen Therapy: Patient Spontanous Breathing and Patient connected to supplemental oxygen  Post-op Assessment: Post-op Vital signs reviewed, Patient's Cardiovascular Status Stable, Respiratory Function Stable, Patent Airway and No signs of Nausea or vomiting  Post-op Vital Signs: Reviewed and stable  Complications: No notable events documented.

## 2021-03-21 NOTE — H&P (Signed)
[facility]   Primary Care Physician:  Verl Bangs, FNP Ophthalmologist: Dr.Nimisha Rathel  Pre-Procedure History & Physical: HPI:  Amy Rangel is a 65 y.o. female here for cataract surgery.   Past Medical History:  Diagnosis Date   Cancer ([place])    skin ca   Complication of anesthesia    Slow to wake, gets the "shakes"   Fibromyalgia    Hypothyroidism    ICAO (internal carotid artery occlusion)    left   Migraine headache    approx 4-5x/yr   Motion sickness    boats   TIA (transient ischemic attack)    No deficits    Past Surgical History:  Procedure Laterality Date   ABDOMINAL HYSTERECTOMY     BREAST BIOPSY Left 1995   neg   BREAST SURGERY Left 1993   Left lumpectomy/partial mastectomy   CATARACT EXTRACTION W/PHACO Left 03/07/2021   Procedure: CATARACT EXTRACTION PHACO AND INTRAOCULAR LENS PLACEMENT ([place]) LEFT;  Surgeon: Birder Robson, MD;  Location: [facility];  Service: Ophthalmology;  Laterality: Left;  11.77 1:23.9   foot surgery Right    Right foot ganglion cyst removal   TONSILLECTOMY      Prior to Admission medications   Medication Sig Start Date End Date Taking? Authorizing Provider  ARMOUR THYROID 30 MG tablet Take 30 mg by mouth daily. 02/10/21  Yes [provider]  Cholecalciferol 50 MCG (2000 UT) TABS Take by mouth.   Yes [provider]  cyclobenzaprine (FLEXERIL) 5 MG tablet Take 5 mg by mouth 3 (three) times daily. 01/19/20  Yes [provider]  GNP GARLIC EXTRACT PO Take by mouth.   Yes [provider]  Magnesium Gluconate 550 MG TABS Take by mouth.   Yes [provider]  Omega 3 1000 MG CAPS Take by mouth.   Yes [provider]  Riboflavin (B-2-400 PO) Take by mouth.   Yes [provider]  traMADol (ULTRAM) 50 MG tablet Take 50 mg by mouth daily as needed.  10/12/19  Yes [provider]  Turmeric Curcumin 500 MG CAPS Take by mouth.   Yes [provider]   Zinc Sulfate 66 MG TABS Take by mouth.   Yes [provider]  butalbital-acetaminophen-caffeine (FIORICET) 50-325-40 MG tablet Take by mouth. 04/08/19   [provider]  clonazePAM (KLONOPIN) 0.5 MG tablet Take 0.5 tablets (0.25 mg total) by mouth daily. Patient taking differently: Take 0.25 mg by mouth daily as needed. 04/21/20   Verl Bangs, FNP    Allergies as of 02/09/2021 - Review Complete 10/13/2020  Allergen Reaction Noted   Bupropion Swelling 12/25/2012   Citrullus vulgaris Other (See Comments), Rash, and Swelling 08/31/2019   Gabapentin Other (See Comments) and Shortness Of Breath 09/06/2014   Iodinated diagnostic agents Anaphylaxis 02/05/2014   Iodine Other (See Comments) 02/05/2014   Iopamidol Other (See Comments) 02/05/2014   Levetiracetam Other (See Comments) and Swelling 08/26/2014   Mangifera indica Rash and Swelling 08/31/2019   Neomycin-polymyxin-dexameth Rash and Swelling 08/31/2019   Papaya Rash and Swelling 08/31/2019   Prednisone Anaphylaxis 10/17/2016   Sulfa antibiotics Anaphylaxis and Other (See Comments) 10/15/2019   Zolpidem Diarrhea 11/30/2015   Teriparatide Other (See Comments) 08/23/2015   Aluminum Other (See Comments) 11/30/2015   Amoxicillin  12/25/2012   Atorvastatin Diarrhea 04/07/2018   Codeine Other (See Comments) 12/25/2012   Diclofenac potassium Diarrhea 02/05/2014   Duloxetine Diarrhea 11/16/2015   Duloxetine hcl  12/25/2012   Erythromycin Swelling  12/25/2012   Escitalopram Other (See Comments) 38/04/1750   Eslicarbazepine Diarrhea and Other (See Comments) 08/31/2019   Hepatitis b vaccine Other (See Comments) 12/25/2012   Ibuprofen Other (See Comments) 11/30/2015   Ketoprofen Other (See Comments) 12/25/2012   Lanolin Other (See Comments) 12/25/2012   Metoclopramide  08/31/2019   Midodrine hcl Other (See Comments) 12/25/2012   Monosodium glutamate  02/05/2014   Morphine Nausea And Vomiting 08/26/2014   Moxifloxacin  Other (See Comments) 02/05/2014   Moxifloxacin hcl  12/25/2012   Naproxen  12/25/2012   Naproxen sodium Other (See Comments) 08/26/2014   Other  01/05/2020   Oxycodone  12/25/2012   Oxycodone-acetaminophen Other (See Comments) 02/05/2014   Sulfamethoxazole-trimethoprim  12/25/2012   Trimethoprim  01/05/2013   Zonisamide Other (See Comments) 12/25/2012   Lamotrigine Rash 08/26/2014   Neomycin-polymyxin b gu Rash 11/12/2017   Sertraline Rash 08/26/2014   Sertraline hcl Rash 12/25/2012   Valproic acid Nausea Only 08/31/2019    Family History  Problem Relation Age of Onset   Hypothyroidism Mother    Breast cancer Mother 19   Leukemia Father     Social History   Socioeconomic History   Marital status: Divorced    Spouse name: Not on file   Number of children: Not on file   Years of education: Not on file   Highest education level: Not on file  Occupational History   Not on file  Tobacco Use   Smoking status: Former    Types: Cigarettes    Quit date: 10/15/2018    Years since quitting: 2.4   Smokeless tobacco: Never  Vaping Use   Vaping Use: Never used  Substance and Sexual Activity   Alcohol use: Not Currently   Drug use: Not Currently   Sexual activity: Not on file  Other Topics Concern   Not on file  Social History Narrative   Not on file   Social Determinants of Health   Financial Resource Strain: Not on file  Food Insecurity: Not on file  Transportation Needs: Not on file  Physical Activity: Not on file  Stress: Not on file  Social Connections: Not on file  Intimate Partner Violence: Not on file    Review of Systems: See HPI, otherwise negative ROS  Physical Exam: BP 139/65   Pulse 61   Temp (!) 97.4 F (36.3 C) (Temporal)   Resp 14   Ht 5\' 2"  (1.575 m)   Wt 64.9 kg   SpO2 99%   BMI 26.16 kg/m  General:   Alert, cooperative in NAD Head:  Normocephalic and atraumatic. Respiratory:  Normal work of breathing. Cardiovascular:   RRR  Impression/Plan: Amy Rangel is here for cataract surgery.  Risks, benefits, limitations, and alternatives regarding cataract surgery have been reviewed with the patient.  Questions have been answered.  All parties agreeable.   Birder Robson, MD  03/21/2021, 10:06 AM

## 2021-03-22 ENCOUNTER — Encounter: Payer: Self-pay | Admitting: Ophthalmology

## 2021-03-30 DIAGNOSIS — M25562 Pain in left knee: Secondary | ICD-10-CM | POA: Diagnosis not present

## 2021-03-30 DIAGNOSIS — M797 Fibromyalgia: Secondary | ICD-10-CM | POA: Diagnosis not present

## 2021-03-30 DIAGNOSIS — M79651 Pain in right thigh: Secondary | ICD-10-CM | POA: Diagnosis not present

## 2021-03-30 DIAGNOSIS — S32009A Unspecified fracture of unspecified lumbar vertebra, initial encounter for closed fracture: Secondary | ICD-10-CM | POA: Diagnosis not present

## 2021-03-30 DIAGNOSIS — M25561 Pain in right knee: Secondary | ICD-10-CM | POA: Diagnosis not present

## 2021-03-30 DIAGNOSIS — M546 Pain in thoracic spine: Secondary | ICD-10-CM | POA: Diagnosis not present

## 2021-03-30 DIAGNOSIS — M79605 Pain in left leg: Secondary | ICD-10-CM | POA: Diagnosis not present

## 2021-03-30 DIAGNOSIS — F17200 Nicotine dependence, unspecified, uncomplicated: Secondary | ICD-10-CM | POA: Diagnosis not present

## 2021-03-30 DIAGNOSIS — M545 Low back pain, unspecified: Secondary | ICD-10-CM | POA: Diagnosis not present

## 2021-03-30 DIAGNOSIS — R293 Abnormal posture: Secondary | ICD-10-CM | POA: Diagnosis not present

## 2021-03-30 DIAGNOSIS — M5412 Radiculopathy, cervical region: Secondary | ICD-10-CM | POA: Diagnosis not present

## 2021-03-30 DIAGNOSIS — M6281 Muscle weakness (generalized): Secondary | ICD-10-CM | POA: Diagnosis not present

## 2021-04-05 DIAGNOSIS — E890 Postprocedural hypothyroidism: Secondary | ICD-10-CM | POA: Diagnosis not present

## 2021-04-12 ENCOUNTER — Encounter: Payer: Self-pay | Admitting: *Deleted

## 2021-04-12 DIAGNOSIS — M810 Age-related osteoporosis without current pathological fracture: Secondary | ICD-10-CM | POA: Diagnosis not present

## 2021-04-12 DIAGNOSIS — E890 Postprocedural hypothyroidism: Secondary | ICD-10-CM | POA: Diagnosis not present

## 2021-04-26 ENCOUNTER — Telehealth: Payer: Self-pay | Admitting: *Deleted

## 2021-04-26 NOTE — Telephone Encounter (Signed)
Message left with patient regarding upcoming appts for the lung nodule clinic. Instructed pt to call back with any questions or if needs to reschedule appts.

## 2021-05-01 ENCOUNTER — Ambulatory Visit
Admission: RE | Admit: 2021-05-01 | Discharge: 2021-05-01 | Disposition: A | Discharge status: Home / Self Care | Payer: PPO | Source: Ambulatory Visit | Attending: Oncology | Admitting: Oncology

## 2021-05-01 ENCOUNTER — Other Ambulatory Visit: Payer: Self-pay

## 2021-05-01 DIAGNOSIS — R918 Other nonspecific abnormal finding of lung field: Secondary | ICD-10-CM | POA: Diagnosis not present

## 2021-05-01 DIAGNOSIS — R911 Solitary pulmonary nodule: Secondary | ICD-10-CM | POA: Insufficient documentation

## 2021-05-01 DIAGNOSIS — I700 Atherosclerosis of aorta: Secondary | ICD-10-CM | POA: Diagnosis not present

## 2021-05-02 ENCOUNTER — Inpatient Hospital Stay: Payer: PPO | Attending: Nurse Practitioner | Admitting: Oncology

## 2021-05-02 DIAGNOSIS — R911 Solitary pulmonary nodule: Secondary | ICD-10-CM

## 2021-05-02 NOTE — Progress Notes (Signed)
Pulmonary Nodule Clinic Consult note [facility]  [phone] [phone]  Patient Care Team: Malfi, Lupita Raider, FNP as PCP - General (Family Medicine) Lorine Bears Lupita Raider, FNP (Family Medicine)   Name of the patient: Amy Rangel  [id number]  [birth year]   Date of visit: 05/02/2021   Diagnosis- Lung Nodule  Virtual Visit via Telephone Note   I connected with Mrs. Humber on 05/02/21 at 10am by telephone visit and verified that I am speaking with the correct person using two identifiers.   I discussed the limitations, risks, security and privacy concerns of performing an evaluation and management service by telemedicine and the availability of in-person appointments. I also discussed with the patient that there may be a patient responsible charge related to this service. The patient expressed understanding and agreed to proceed.   Other persons participating in the visit and their role in the encounter:   Patient's location: Home    Provider's location: Clinic   Chief complaint/ Reason for visit- Pulmonary Nodule Clinic Initial Visit  Past Medical History:  [name] is a 65 year old female with past medical history significant for fibromyalgia, gastric ulcer and migraines who recently presented to [facility] emergency department for evaluation of abdominal and flank pain and diarrhea.  Work-up included IVF with basic labs along with CT imaging.  CT scan showed a 3.3 mm right lower pulmonary nodule.  Recommended follow-up in approximately 12 months.  CT chest without contrast from 10/26/2020 showed tiny pulmonary nodules measuring up to 6 mm in size.  Noncontrast chest CT in 3 to 6 months is recommended.  If nodules are stable at that time recommend follow-up in 18 to 24 months.  Does not appear the radiologist compared the scan to his CT abdomen from [facility].  There is a 4 mm right lower lobe pulmonary nodule, 6 mm and 4 mm right lower lobe perifissural nodules  likely lymph nodes and a 4 mm left lower lobe nodule.   Interval history-[name] presents today to review most recent CT scan.   She is a former smoker.  She quit in 2019.  States she started smoking at the age of 32.  She smoked for approximately 24 years.  She smoked less than 1 pack of cigarettes per day typically 10 to 15 cigarettes daily.  She quit multiple times in the past.   Recently had XRT to thyroid for overactive thyroid. U/S of thyroid from 09/14/20 showed 5 nodules and 2 that were spongiform. Endorses a sore throat since treatment.   She retired about 1 year ago.  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  She did not wear protective eye or face wear.  Denies a personal history of cancer.  Has a family history of brain cancer of both of her maternal grandparents and stomach cancer in her paternal grandfather.  Her mother had breast cancer which metastasized to her lymph nodes.  Currently doing well. She stays active and goes to the gym daily.  Reports working out in her yard and garden frequently.  Denies any new concerns today.  ECOG FS:0 - Asymptomatic  Review of systems- Review of Systems  All other systems reviewed and are negative.   Allergies  Allergen Reactions   Bupropion Swelling    Lip swelling    Citrullus Vulgaris Other (See Comments), Rash and  Swelling   Gabapentin Other (See Comments) and Shortness Of Breath    "Acid reflux and chest pressure"    Iodinated Diagnostic Agents Anaphylaxis    Uncoded Allergy. Allergen: ct contrast dye (with steroids), Other Reaction: THROAT TIGHTNING   Iodine Other (See Comments)    Other reaction(s): Other (See Comments) Other Reaction: ct contrast & xray dye Other Reaction: ct contrast & xray dye    Iopamidol Other (See Comments)    Other reaction(s): Other (See Comments) Other  Reaction: other reaction Other Reaction: other reaction    Levetiracetam Other (See Comments) and Swelling    Facial swelling, upset stomach Swelling of face and felt drunk    Mangifera Indica Rash and Swelling   Neomycin-Polymyxin-Dexameth Rash and Swelling   Papaya Rash and Swelling   Prednisone Anaphylaxis   Sulfa Antibiotics Anaphylaxis and Other (See Comments)    Uncoded Allergy. Allergen: xray dye Uncoded Allergy. Allergen: ct contrast dye (with steroids), Other Reaction: THROAT TIGHTNING    Zolpidem Diarrhea   Teriparatide Other (See Comments)    Patient states that when she took this medication she had a sore throat and was very fatigue. Patient states that when she took this medication she had a sore throat and was very fatigue.    Aluminum Other (See Comments)    Growths Growths    Amoxicillin     Other reaction(s): Unknown Other reaction(s): Unknown    Atorvastatin Diarrhea   Codeine Other (See Comments)    Other reaction(s): Other (See Comments) Other Reaction: NOT ASSESSED Other Reaction: NOT ASSESSED    Diclofenac Potassium Diarrhea   Duloxetine Diarrhea   Duloxetine Hcl     Other reaction(s): Other (See Comments) constipation   Erythromycin Swelling    Swelling of face    Escitalopram Other (See Comments)    "Insomnia" "Insomnia"    Eslicarbazepine Diarrhea and Other (See Comments)    Diarrhea, insomnia   Hepatitis B Vaccine Other (See Comments)    Chest pain and arm pain    Ibuprofen Other (See Comments)    "Stomach upset real bad" "Stomach upset real bad"    Ketoprofen Other (See Comments)    "Stomach upset really bad"    Lanolin Other (See Comments)    Other reaction(s): Other (See Comments) Other Reaction: NOT ASSESSED Other Reaction: NOT ASSESSED    Metoclopramide     error   Midodrine Hcl Other (See Comments)    headache   Monosodium Glutamate     Other reaction(s): Unknown Other reaction(s): Unknown    Morphine Nausea And  Vomiting    vomiting   Moxifloxacin Other (See Comments)    Other reaction(s): Other (See Comments) Other Reaction: OTHER REACTION Other Reaction: OTHER REACTION    Moxifloxacin Hcl    Naproxen    Naproxen Sodium Other (See Comments)    Bloody stools Bloody stool    Other     Client reports 35 allergies. Copy of allergies scanned. Garlan Fair RN, [facility] Department   Oxycodone    Oxycodone-Acetaminophen Other (See Comments)    Other reaction(s): Other (See Comments) Other Reaction: NOT ASSESSED Other Reaction: NOT ASSESSED    Sulfamethoxazole-Trimethoprim    Trimethoprim    Zonisamide Other (See Comments)    Bladder infection     Lamotrigine Rash   Neomycin-Polymyxin B Gu Rash   Sertraline Rash   Sertraline Hcl Rash   Valproic Acid Nausea Only     Past Medical History:  Diagnosis Date   Cancer (  [place])    skin ca   Complication of anesthesia    Slow to wake, gets the "shakes"   Fibromyalgia    Hypothyroidism    ICAO (internal carotid artery occlusion)    left   Migraine headache    approx 4-5x/yr   Motion sickness    boats   TIA (transient ischemic attack)    No deficits     Past Surgical History:  Procedure Laterality Date   ABDOMINAL HYSTERECTOMY     BREAST BIOPSY Left 1995   neg   BREAST SURGERY Left 1993   Left lumpectomy/partial mastectomy   CATARACT EXTRACTION W/PHACO Left 03/07/2021   Procedure: CATARACT EXTRACTION PHACO AND INTRAOCULAR LENS PLACEMENT ([place]) LEFT;  Surgeon: Birder Robson, MD;  Location: [facility];  Service: Ophthalmology;  Laterality: Left;  11.77 1:23.9   CATARACT EXTRACTION W/PHACO Right 03/21/2021   Procedure: CATARACT EXTRACTION PHACO AND INTRAOCULAR LENS PLACEMENT ([facility]) RIGHT 6.40 00:42.1;  Surgeon: Birder Robson, MD;  Location: [facility];  Service: Ophthalmology;  Laterality: Right;   foot surgery Right    Right foot ganglion cyst removal   TONSILLECTOMY      Social History    Socioeconomic History   Marital status: Divorced    Spouse name: Not on file   Number of children: Not on file   Years of education: Not on file   Highest education level: Not on file  Occupational History   Not on file  Tobacco Use   Smoking status: Former    Types: Cigarettes    Quit date: 10/15/2018    Years since quitting: 2.5   Smokeless tobacco: Never  Vaping Use   Vaping Use: Never used  Substance and Sexual Activity   Alcohol use: Not Currently   Drug use: Not Currently   Sexual activity: Not on file  Other Topics Concern   Not on file  Social History Narrative   Not on file   Social Determinants of Health   Financial Resource Strain: Not on file  Food Insecurity: Not on file  Transportation Needs: Not on file  Physical Activity: Not on file  Stress: Not on file  Social Connections: Not on file  Intimate Partner Violence: Not on file    Family History  Problem Relation Age of Onset   Hypothyroidism Mother    Breast cancer Mother 19   Leukemia Father      Current Outpatient Medications:    ARMOUR THYROID 30 MG tablet, Take 30 mg by mouth daily., Disp: , Rfl:    butalbital-acetaminophen-caffeine (FIORICET) 50-325-40 MG tablet, Take by mouth., Disp: , Rfl:    Cholecalciferol 50 MCG (2000 UT) TABS, Take by mouth., Disp: , Rfl:    clonazePAM (KLONOPIN) 0.5 MG tablet, Take 0.5 tablets (0.25 mg total) by mouth daily. (Patient taking differently: Take 0.25 mg by mouth daily as needed.), Disp: 90 tablet, Rfl: 0   cyclobenzaprine (FLEXERIL) 5 MG tablet, Take 5 mg by mouth 3 (three) times daily., Disp: , Rfl:    GNP GARLIC EXTRACT PO, Take by mouth., Disp: , Rfl:    Magnesium Gluconate 550 MG TABS, Take by mouth., Disp: , Rfl:    Omega 3 1000 MG CAPS, Take by mouth., Disp: , Rfl:    Riboflavin (B-2-400 PO), Take by mouth., Disp: , Rfl:    traMADol (ULTRAM) 50 MG tablet, Take 50 mg by mouth daily as needed. , Disp: , Rfl:    Turmeric Curcumin 500 MG CAPS, Take by  mouth., Disp: , Rfl:    Zinc Sulfate 66 MG TABS, Take by mouth., Disp: , Rfl:   Physical exam: There were no vitals filed for this visit. Physical Exam Neurological:     Mental Status: She is alert and oriented to person, place, and time.     CMP Latest Ref Rng & Units 06/27/2020  Glucose 65 - 99 mg/dL 92  BUN 7 - 25 mg/dL 14  Creatinine 0.50 - 0.99 mg/dL 0.74  Sodium 135 - 146 mmol/L 144  Potassium 3.5 - 5.3 mmol/L 3.5  Chloride 98 - 110 mmol/L 105  CO2 20 - 32 mmol/L 30  Calcium 8.6 - 10.4 mg/dL 9.6  Total Protein 6.1 - 8.1 g/dL 6.6  Total Bilirubin 0.2 - 1.2 mg/dL 0.4  AST 10 - 35 U/L 17  ALT 6 - 29 U/L 16   CBC Latest Ref Rng & Units 06/27/2020  WBC 3.8 - 10.8 Thousand/uL 5.4  Hemoglobin 11.7 - 15.5 g/dL 13.5  Hematocrit 35.0 - 45.0 % 40.2  Platelets 140 - 400 Thousand/uL 293    No images are attached to the encounter.  CT Chest Wo Contrast  Result Date: 05/01/2021 CLINICAL DATA:  Follow-up lung nodule. EXAM: CT CHEST WITHOUT CONTRAST TECHNIQUE: Multidetector CT imaging of the chest was performed following the standard protocol without IV contrast. COMPARISON:  10/26/2020. FINDINGS: Cardiovascular: Normal heart size. No pericardial effusion. Mild aortic atherosclerosis and LAD coronary artery calcifications. Mediastinum/Nodes: Bilateral thyroid nodules are again noted measuring up to 1.5 cm, image 13/2. This has been evaluated on previous imaging. (ref: J Am Coll Radiol. 2015 Feb;12(2): 143-50). The trachea appears patent and is midline. Normal appearance of the esophagus. Lungs/Pleura: No pleural effusion identified. No pleural effusion, airspace consolidation, or atelectasis. Previous right lower lobe lung nodule is stable measuring 4 mm, image 113/3. 6 mm perifissural nodule in the right lower lobe is stable, image 75/3. The 4 mm right lower perifissural nodule is also unchanged, image 79/3. Left lower lobe lung nodule measures 3 mm, image 93/4. Unchanged. No new or  enlarging lung nodules. Upper Abdomen: No acute abnormality within the imaged portions of the upper abdomen. Aortic atherosclerosis. Musculoskeletal: No chest wall mass or suspicious bone lesions identified. Superior endplate compression deformity involving the L1 vertebral body appears stable from previous exam. IMPRESSION: 1. The 4 mm lower lobe pulmonary nodules are stable in the interval. No routine follow-up imaging is recommended per [place]. These guidelines do not apply to immunocompromised patients and patients with cancer. Follow up in patients with significant comorbidities as clinically warranted. For lung cancer screening, adhere to Lung-RADS guidelines. Reference: Radiology. 2017; 284(1):228-43. She 2. Unchanged appearance of small perifissural nodules in the right lower lobe which likely represents a benign intrapulmonary lymph nodes. No follow-up is indicated for these nodules. 3. Aortic Atherosclerosis (ICD10-I70.0). Coronary artery calcifications. 4. Bilateral thyroid nodules. This has been evaluated on previous imaging. (ref: J Am Coll Radiol. 2015 Feb;12(2): 143-50). Electronically Signed   By: Kerby Moors M.D.   On: 05/01/2021 16:40      Assessment and plan- Patient is a 65 y.o. female who presents to pulmonary nodule clinic for follow-up of incidental lung nodules.  A telephone visit was conducted to review most recent CT scan results.    CT chest without contrast from 05/01/2021 shows stable 4 mm lower lobe pulmonary nodules.  She does not need any additional follow-up per Fleischner's guidelines.  Unchanged appearance of small perifissural nodules in right lower lobe  which likely represent a benign intrapulmonary lymph nodes.   No additional follow-up is needed in our pulmonary nodule clinic.  Patient is agreeable to the low-dose CT screening program.  Ambulatory referral sent.  Patient will need repeat CT scan in 1 year.  Plan: No additional follow-up  needed in our clinic.  Disposition: Referral to low-dose CT screening program.  Visit Diagnosis 1. Lung nodule     Patient expressed understanding and was in agreement with this plan. She also understands that She can call clinic at any time with any questions, concerns, or complaints.   I spent 15 minutes dedicated to the care of this patient (face-to-face and non-face-to-face) on the date of the encounter to include what is described in the assessment and plan.  Thank you for allowing me to participate in the care of this very pleasant patient.    Jacquelin Hawking, NP [facility] Cell - [phone] Pager- [phone] 05/02/2021 11:34 AM

## 2021-05-22 DIAGNOSIS — E890 Postprocedural hypothyroidism: Secondary | ICD-10-CM | POA: Diagnosis not present

## 2021-07-07 ENCOUNTER — Other Ambulatory Visit: Payer: Self-pay | Admitting: Infectious Diseases

## 2021-07-07 DIAGNOSIS — M7989 Other specified soft tissue disorders: Secondary | ICD-10-CM

## 2021-07-07 DIAGNOSIS — M79605 Pain in left leg: Secondary | ICD-10-CM

## 2021-07-18 ENCOUNTER — Other Ambulatory Visit: Payer: Self-pay

## 2021-07-18 ENCOUNTER — Ambulatory Visit
Admission: RE | Admit: 2021-07-18 | Discharge: 2021-07-18 | Disposition: A | Discharge status: Home / Self Care | Payer: Medicare HMO | Source: Ambulatory Visit | Attending: Infectious Diseases | Admitting: Infectious Diseases

## 2021-07-18 DIAGNOSIS — M7989 Other specified soft tissue disorders: Secondary | ICD-10-CM | POA: Diagnosis present

## 2021-07-18 DIAGNOSIS — M79605 Pain in left leg: Secondary | ICD-10-CM | POA: Insufficient documentation

## 2021-09-16 IMAGING — MG DIGITAL SCREENING BILAT W/ TOMO W/ CAD
8 series · 8 of 24 positions shown · non-contrast
Comparison: Previous exam(s).

CLINICAL DATA: Screening.

EXAM:
DIGITAL SCREENING BILATERAL MAMMOGRAM WITH TOMO AND CAD

[R CC synth-2D]
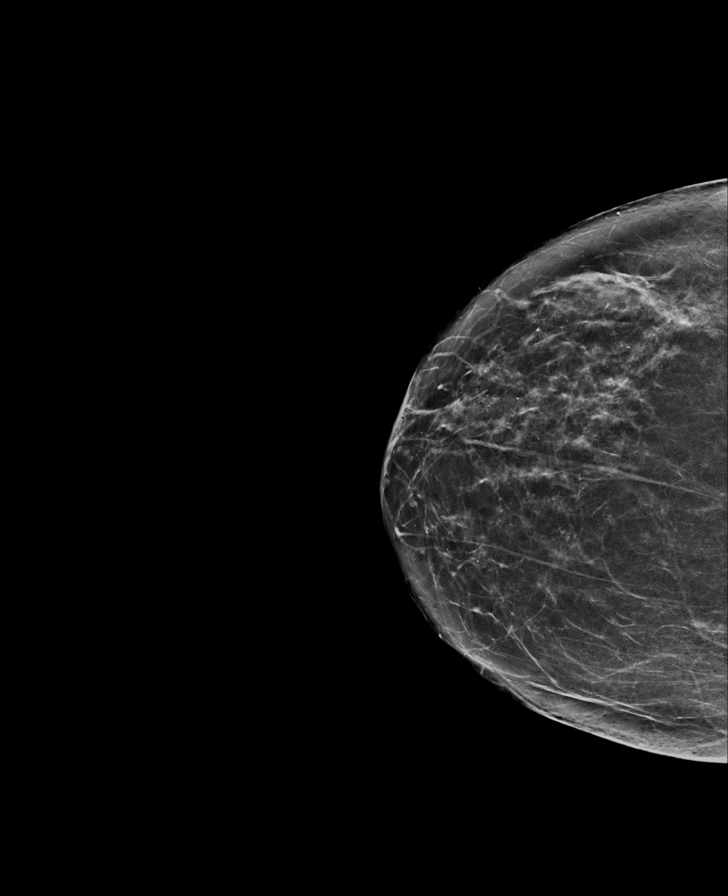

[L CC synth-2D]
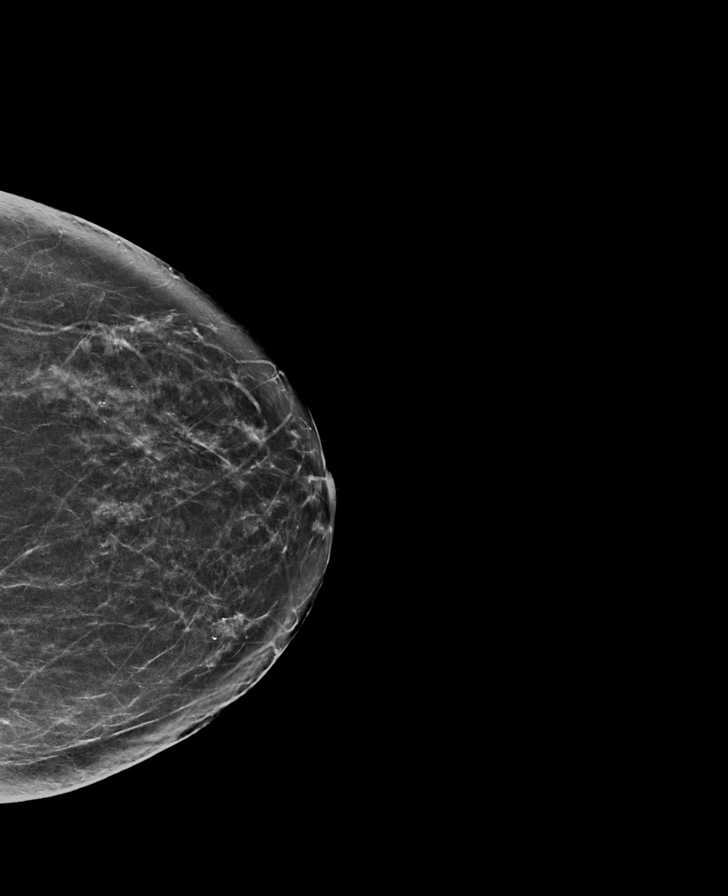

[L MLO synth-2D]
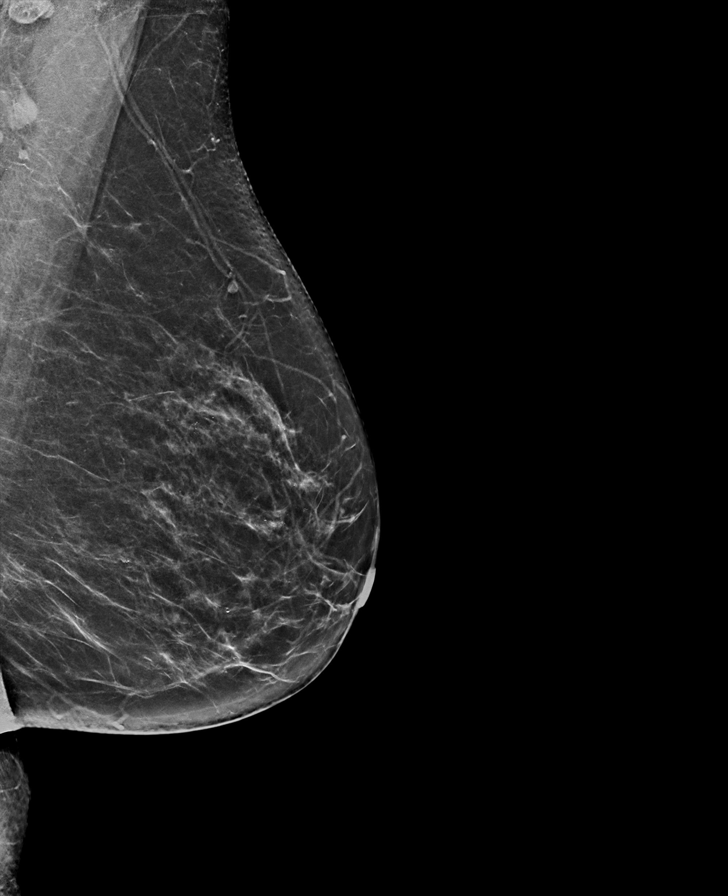

[R MLO synth-2D]
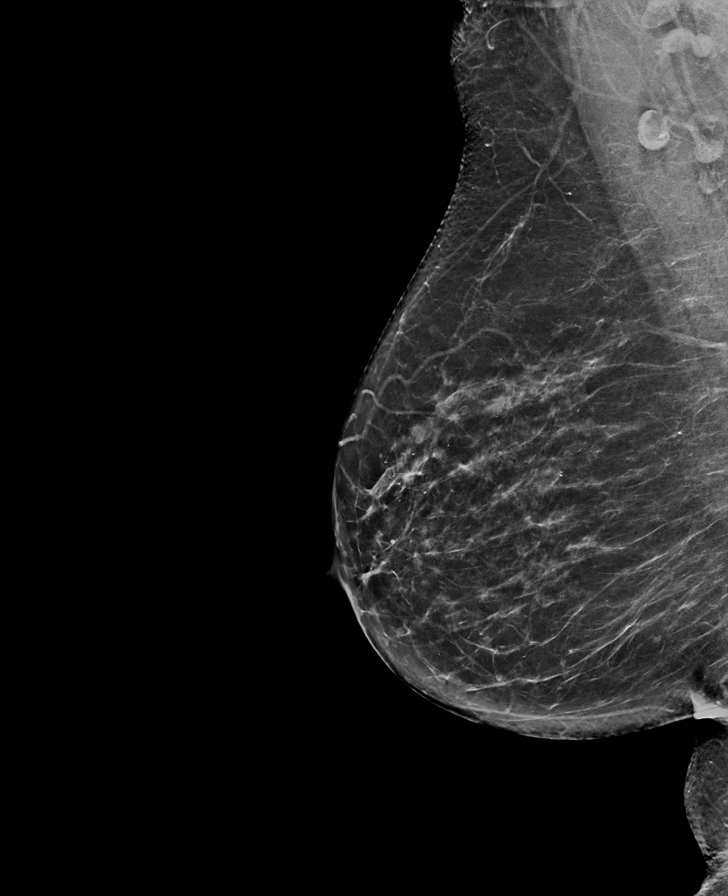

[R CC tomo · tomo slice 33/66.0]
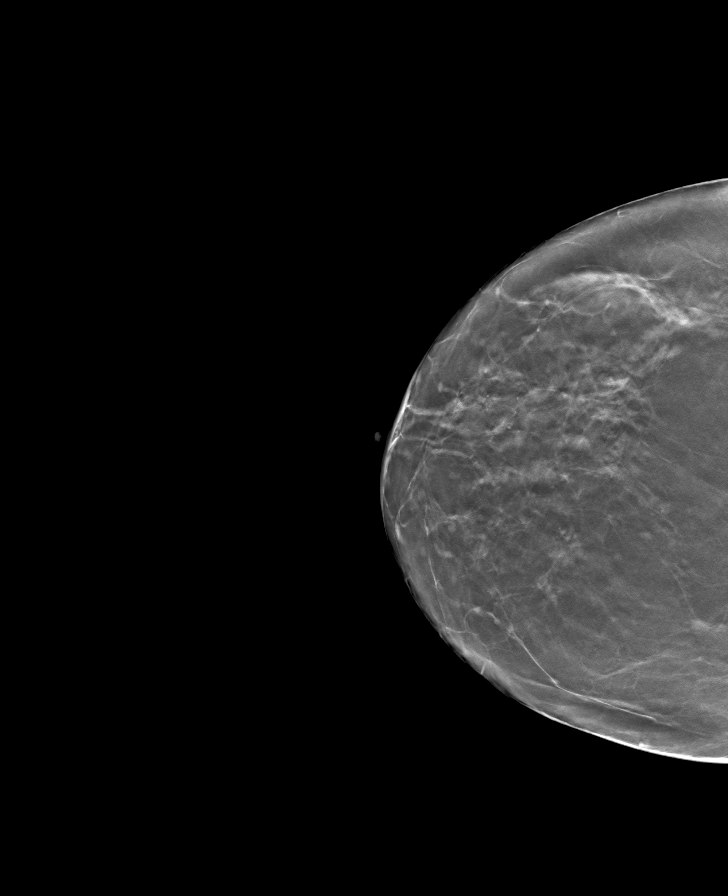

[L CC tomo · tomo slice 35/68.0]
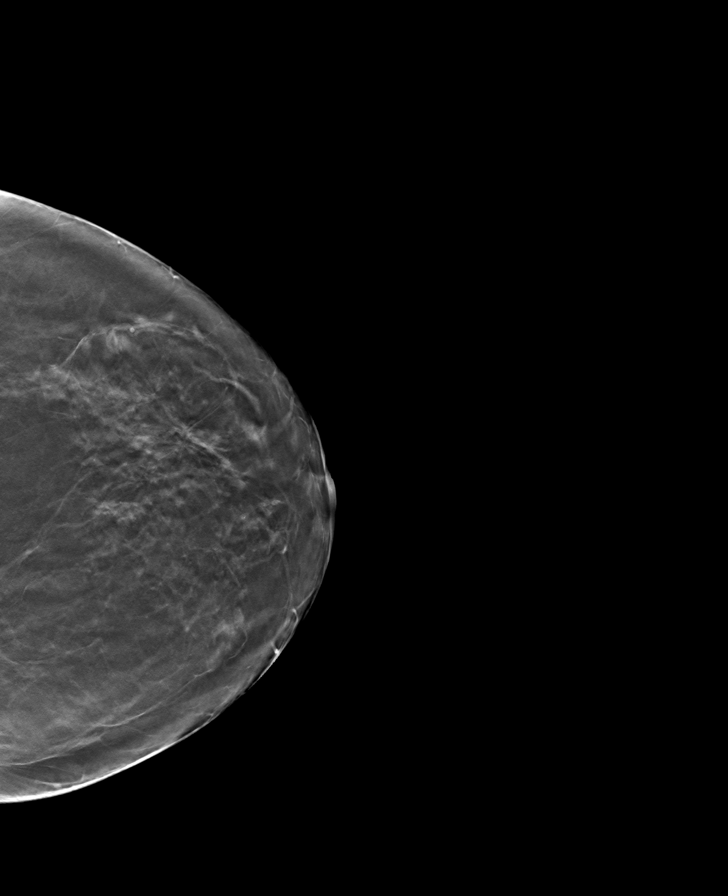

[L MLO tomo · tomo slice 34/67.0]
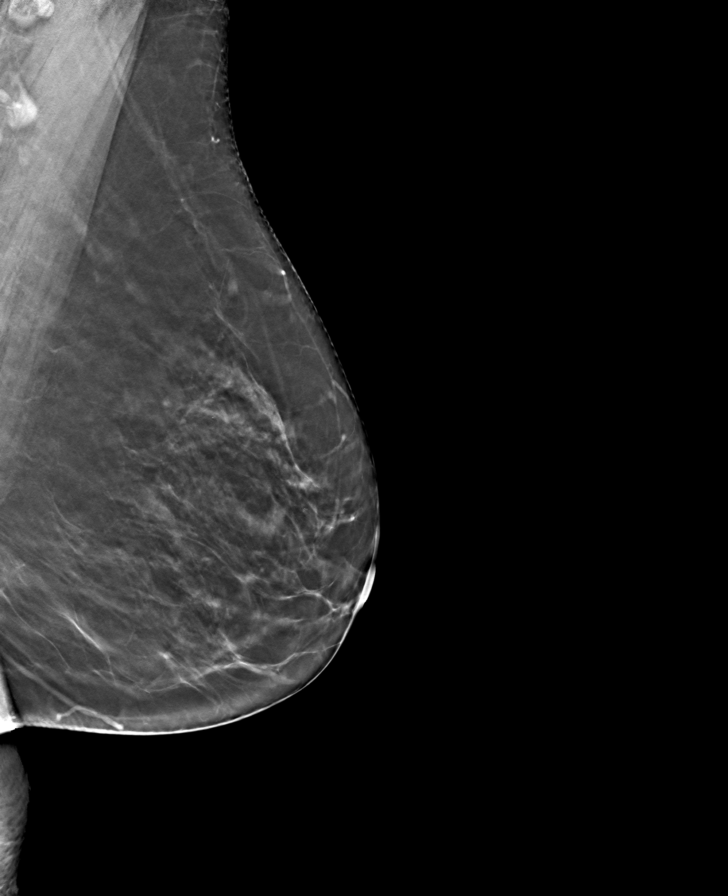

[R MLO tomo · tomo slice 35/68.0]
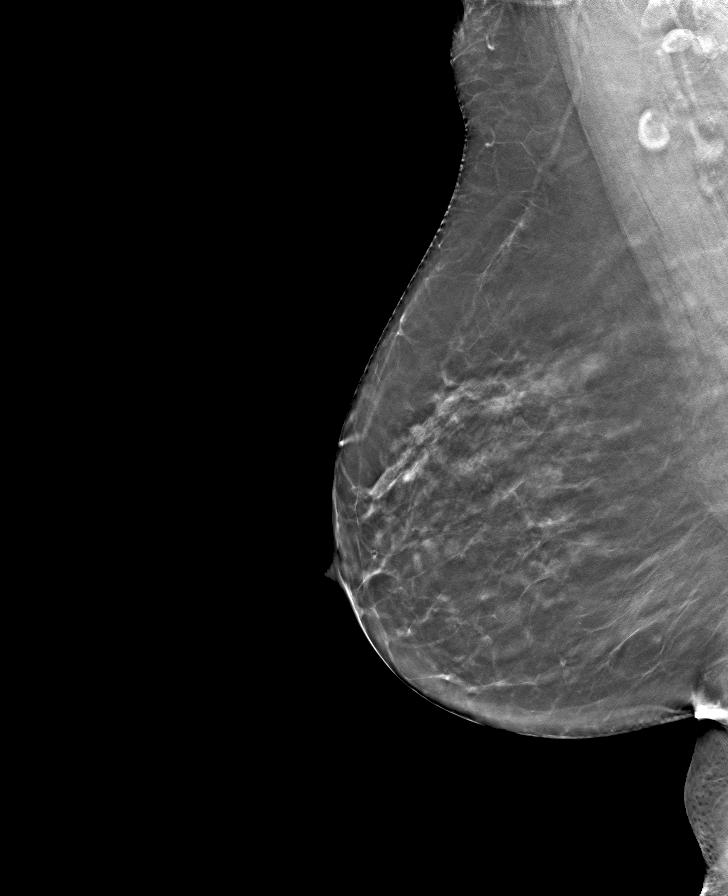

[8 of 24 positions shown; findings below may reference images not displayed]

ACR Breast Density Category b: There are scattered areas of
fibroglandular density.
FINDINGS: In the right breast calcifications requires further evaluation.

In the left breast calcifications requires further evaluation.

Images were processed with CAD.
IMPRESSION: Further evaluation is suggested for possible calcifications in the
right breast.

Further evaluation is suggested for possible calcification in the
left breast.

RECOMMENDATION:
Diagnostic mammogram and possibly ultrasound of both breasts.
(Code:[id number])

The patient will be contacted regarding the findings, and additional
imaging will be scheduled.

BI-RADS CATEGORY  0: Incomplete. Need additional imaging evaluation
and/or prior mammograms for comparison.

## 2022-02-27 ENCOUNTER — Ambulatory Visit: Payer: Medicare HMO | Admitting: Anesthesiology

## 2022-02-27 ENCOUNTER — Encounter
Admission: RE | Disposition: A | Discharge status: Home / Self Care | Payer: Self-pay | Source: Home / Self Care | Attending: Gastroenterology

## 2022-02-27 ENCOUNTER — Encounter: Payer: Self-pay | Admitting: *Deleted

## 2022-02-27 ENCOUNTER — Ambulatory Visit
Admission: RE | Admit: 2022-02-27 | Discharge: 2022-02-27 | Disposition: A | Discharge status: Home / Self Care | Payer: Medicare HMO | Attending: Gastroenterology | Admitting: Gastroenterology

## 2022-02-27 DIAGNOSIS — K640 First degree hemorrhoids: Secondary | ICD-10-CM | POA: Diagnosis not present

## 2022-02-27 DIAGNOSIS — Z8719 Personal history of other diseases of the digestive system: Secondary | ICD-10-CM | POA: Diagnosis present

## 2022-02-27 DIAGNOSIS — F419 Anxiety disorder, unspecified: Secondary | ICD-10-CM | POA: Insufficient documentation

## 2022-02-27 DIAGNOSIS — Z8673 Personal history of transient ischemic attack (TIA), and cerebral infarction without residual deficits: Secondary | ICD-10-CM | POA: Diagnosis not present

## 2022-02-27 DIAGNOSIS — K621 Rectal polyp: Secondary | ICD-10-CM | POA: Diagnosis not present

## 2022-02-27 DIAGNOSIS — K635 Polyp of colon: Secondary | ICD-10-CM | POA: Diagnosis not present

## 2022-02-27 DIAGNOSIS — K5730 Diverticulosis of large intestine without perforation or abscess without bleeding: Secondary | ICD-10-CM | POA: Insufficient documentation

## 2022-02-27 DIAGNOSIS — G709 Myoneural disorder, unspecified: Secondary | ICD-10-CM | POA: Diagnosis not present

## 2022-02-27 DIAGNOSIS — Z87891 Personal history of nicotine dependence: Secondary | ICD-10-CM | POA: Diagnosis not present

## 2022-02-27 DIAGNOSIS — Z1211 Encounter for screening for malignant neoplasm of colon: Secondary | ICD-10-CM | POA: Insufficient documentation

## 2022-02-27 DIAGNOSIS — E039 Hypothyroidism, unspecified: Secondary | ICD-10-CM | POA: Insufficient documentation

## 2022-02-27 HISTORY — PX: COLONOSCOPY WITH PROPOFOL: SHX5780

## 2022-02-27 SURGERY — COLONOSCOPY WITH PROPOFOL
Anesthesia: General

## 2022-02-27 MED ORDER — SODIUM CHLORIDE 0.9 % IV SOLN
INTRAVENOUS | Status: DC
  Administered 2022-02-27: 1000 mL via INTRAVENOUS

## 2022-02-27 MED ORDER — PROPOFOL 500 MG/50ML IV EMUL
INTRAVENOUS | Status: DC | PRN
  Administered 2022-02-27: 150 ug/kg/min via INTRAVENOUS

## 2022-02-27 MED ORDER — PROPOFOL 10 MG/ML IV BOLUS
INTRAVENOUS | Status: DC | PRN
  Administered 2022-02-27: 80 mg via INTRAVENOUS
  Administered 2022-02-27: 20 mg via INTRAVENOUS

## 2022-02-27 MED ORDER — LIDOCAINE HCL (CARDIAC) PF 100 MG/5ML IV SOSY
PREFILLED_SYRINGE | INTRAVENOUS | Status: DC | PRN
  Administered 2022-02-27: 50 mg via INTRAVENOUS

## 2022-02-27 NOTE — Transfer of Care (Signed)
Immediate Anesthesia Transfer of Care Note  Patient: Amy Rangel  Procedure(s) Performed: COLONOSCOPY WITH PROPOFOL  Patient Location: PACU  Anesthesia Type:General  Level of Consciousness: sedated  Airway & Oxygen Therapy: Patient Spontanous Breathing  Post-op Assessment: Report given to RN and Post -op Vital signs reviewed and stable  Post vital signs: Reviewed and stable  Last Vitals:  Vitals Value Taken Time  BP 125/64 02/27/22 1122  Temp    Pulse 67 02/27/22 1122  Resp 16 02/27/22 1122  SpO2 99 % 02/27/22 1122    Last Pain:  Vitals:   02/27/22 1018  TempSrc: Temporal  PainSc: 0-No pain         Complications: No notable events documented.

## 2022-02-27 NOTE — Anesthesia Procedure Notes (Signed)
Date/Time: 02/27/2022 10:50 AM  Performed by: Johnna Acosta, CRNAPre-anesthesia Checklist: Patient identified, Emergency Drugs available, Suction available, Patient being monitored and Timeout performed Patient Re-evaluated:Patient Re-evaluated prior to induction Oxygen Delivery Method: Nasal cannula Preoxygenation: Pre-oxygenation with 100% oxygen Induction Type: IV induction

## 2022-02-27 NOTE — H&P (Signed)
Outpatient short stay form Pre-procedure 02/27/2022  Lesly Rubenstein, MD  Primary Physician: Leonel Ramsay, MD  Reason for visit:  Surveillance colonoscopy  History of present illness:    66 y/o lady with history of hypothyroidism here for surveillance colonoscopy for history of polyps. No blood thinners. No first degree relatives with colon cancer. History of hysterectomy.    Current Facility-Administered Medications:    0.9 %  sodium chloride infusion, , Intravenous, Continuous, Kian Gamarra, Hilton Cork, MD, Last Rate: 20 mL/hr at 02/27/22 1036, 1,000 mL at 02/27/22 1036  Medications Prior to Admission  Medication Sig Dispense Refill Last Dose   ARMOUR THYROID 30 MG tablet Take 30 mg by mouth daily.   02/26/2022   Cholecalciferol 50 MCG (2000 UT) TABS Take by mouth.   Past Week   clonazePAM (KLONOPIN) 0.5 MG tablet Take 0.5 tablets (0.25 mg total) by mouth daily. (Patient taking differently: Take 0.25 mg by mouth daily as needed.) 90 tablet 0 Past Week   cyclobenzaprine (FLEXERIL) 5 MG tablet Take 5 mg by mouth 3 (three) times daily.   Past Week   GNP GARLIC EXTRACT PO Take by mouth.   Past Week   Magnesium Gluconate 550 MG TABS Take by mouth.   Past Week   Omega 3 1000 MG CAPS Take by mouth.   Past Week   Riboflavin (B-2-400 PO) Take by mouth.   Past Week   traMADol (ULTRAM) 50 MG tablet Take 50 mg by mouth daily as needed.    Past Week   Turmeric Curcumin 500 MG CAPS Take by mouth.   Past Week   Zinc Sulfate 66 MG TABS Take by mouth.   Past Week   butalbital-acetaminophen-caffeine (FIORICET) 50-325-40 MG tablet Take by mouth.    at prn     Allergies  Allergen Reactions   Bupropion Swelling    Lip swelling    Citrullus Vulgaris Other (See Comments), Rash and Swelling   Gabapentin Other (See Comments) and Shortness Of Breath    "Acid reflux and chest pressure"    Iodinated Contrast Media Anaphylaxis    Uncoded Allergy. Allergen: ct contrast dye (with steroids), Other  Reaction: THROAT TIGHTNING   Iodine Other (See Comments)    Other reaction(s): Other (See Comments) Other Reaction: ct contrast & xray dye Other Reaction: ct contrast & xray dye    Iopamidol Other (See Comments)    Other reaction(s): Other (See Comments) Other Reaction: other reaction Other Reaction: other reaction    Levetiracetam Other (See Comments) and Swelling    Facial swelling, upset stomach Swelling of face and felt drunk    Mangifera Indica Rash and Swelling   Neomycin-Polymyxin-Dexameth Rash and Swelling   Papaya Rash and Swelling   Prednisone Anaphylaxis   Sulfa Antibiotics Anaphylaxis and Other (See Comments)    Uncoded Allergy. Allergen: xray dye Uncoded Allergy. Allergen: ct contrast dye (with steroids), Other Reaction: THROAT TIGHTNING    Zolpidem Diarrhea   Teriparatide Other (See Comments)    Patient states that when she took this medication she had a sore throat and was very fatigue. Patient states that when she took this medication she had a sore throat and was very fatigue.    Aluminum Other (See Comments)    Growths Growths    Amoxicillin     Other reaction(s): Unknown Other reaction(s): Unknown    Atorvastatin Diarrhea   Codeine Other (See Comments)    Other reaction(s): Other (See Comments) Other Reaction: NOT ASSESSED Other Reaction:  NOT ASSESSED    Diclofenac Potassium Diarrhea   Duloxetine Diarrhea   Duloxetine Hcl     Other reaction(s): Other (See Comments) constipation   Erythromycin Swelling    Swelling of face    Escitalopram Other (See Comments)    "Insomnia" "Insomnia"    Eslicarbazepine Diarrhea and Other (See Comments)    Diarrhea, insomnia   Hepatitis B Vaccine Other (See Comments)    Chest pain and arm pain    Ibuprofen Other (See Comments)    "Stomach upset real bad" "Stomach upset real bad"    Ketoprofen Other (See Comments)    "Stomach upset really bad"    Lanolin Other (See Comments)    Other reaction(s): Other  (See Comments) Other Reaction: NOT ASSESSED Other Reaction: NOT ASSESSED    Metoclopramide     error   Midodrine Hcl Other (See Comments)    headache   Monosodium Glutamate     Other reaction(s): Unknown Other reaction(s): Unknown    Morphine Nausea And Vomiting    vomiting   Moxifloxacin Other (See Comments)    Other reaction(s): Other (See Comments) Other Reaction: OTHER REACTION Other Reaction: OTHER REACTION    Moxifloxacin Hcl    Naproxen    Naproxen Sodium Other (See Comments)    Bloody stools Bloody stool    Other     Client reports 35 allergies. Copy of allergies scanned. Garlan Fair RN, [facility] Department   Oxycodone    Oxycodone-Acetaminophen Other (See Comments)    Other reaction(s): Other (See Comments) Other Reaction: NOT ASSESSED Other Reaction: NOT ASSESSED    Sulfamethoxazole-Trimethoprim    Trimethoprim    Zonisamide Other (See Comments)    Bladder infection     Lamotrigine Rash   Neomycin-Polymyxin B Gu Rash   Sertraline Rash   Sertraline Hcl Rash   Valproic Acid Nausea Only     Past Medical History:  Diagnosis Date   Cancer ([place])    skin ca   Complication of anesthesia    Slow to wake, gets the "shakes"   Fibromyalgia    Hypothyroidism    ICAO (internal carotid artery occlusion)    left   Migraine headache    approx 4-5x/yr   Motion sickness    boats   TIA (transient ischemic attack)    No deficits    Review of systems:  Otherwise negative.    Physical Exam  Gen: Alert, oriented. Appears stated age.  HEENT: PERRLA. Lungs: No respiratory distress CV: RRR Abd: soft, benign, no masses Ext: No edema    Planned procedures: Proceed with colonoscopy. The patient understands the nature of the planned procedure, indications, risks, alternatives and potential complications including but not limited to bleeding, infection, perforation, damage to internal organs and possible oversedation/side effects from anesthesia. The  patient agrees and gives consent to proceed.  Please refer to procedure notes for findings, recommendations and patient disposition/instructions.     Lesly Rubenstein, MD [facility]

## 2022-02-27 NOTE — Interval H&P Note (Signed)
History and Physical Interval Note:  02/27/2022 10:46 AM  Amy Rangel  has presented today for surgery, with the diagnosis of personal history colon polyps.  The various methods of treatment have been discussed with the patient and family. After consideration of risks, benefits and other options for treatment, the patient has consented to  Procedure(s): COLONOSCOPY WITH PROPOFOL (N/A) as a surgical intervention.  The patient's history has been reviewed, patient examined, no change in status, stable for surgery.  I have reviewed the patient's chart and labs.  Questions were answered to the patient's satisfaction.     Lesly Rubenstein  Ok to proceed with colonoscopy

## 2022-02-27 NOTE — Op Note (Signed)
[facility] Gastroenterology Patient Name: Amy Rangel Procedure Date: 02/27/2022 10:43 AM MRN: [id number] Account #: [id number] Date of Birth: Jun 28, 1956 Admit Type: Outpatient Age: 66 Room: [facility] ENDO ROOM 1 Gender: Female Note Status: Finalized Instrument Name: Park Meo [id number] Procedure:             Colonoscopy Indications:           Surveillance: Personal history of colonic polyps                         (unknown histology) on last colonoscopy 5 years ago Providers:             Andrey Farmer MD, MD Referring MD:          Adrian Prows (Referring MD) Medicines:             Monitored Anesthesia Care Complications:         No immediate complications. Estimated blood loss:                         Minimal. Procedure:             Pre-Anesthesia Assessment:                        - Prior to the procedure, a History and Physical was                         performed, and patient medications and allergies were                         reviewed. The patient is competent. The risks and                         benefits of the procedure and the sedation options and                         risks were discussed with the patient. All questions                         were answered and informed consent was obtained.                         Patient identification and proposed procedure were                         verified by the physician, the nurse, the                         anesthesiologist, the anesthetist and the technician                         in the endoscopy suite. Mental Status Examination:                         alert and oriented. Airway Examination: normal                         oropharyngeal airway and neck mobility. Respiratory  Examination: clear to auscultation. CV Examination:                         normal. Prophylactic Antibiotics: The patient does not                         require prophylactic antibiotics. Prior                          Anticoagulants: The patient has taken no previous                         anticoagulant or antiplatelet agents. ASA Grade                         Assessment: II - A patient with mild systemic disease.                         After reviewing the risks and benefits, the patient                         was deemed in satisfactory condition to undergo the                         procedure. The anesthesia plan was to use monitored                         anesthesia care (MAC). Immediately prior to                         administration of medications, the patient was                         re-assessed for adequacy to receive sedatives. The                         heart rate, respiratory rate, oxygen saturations,                         blood pressure, adequacy of pulmonary ventilation, and                         response to care were monitored throughout the                         procedure. The physical status of the patient was                         re-assessed after the procedure.                        After obtaining informed consent, the colonoscope was                         passed under direct vision. Throughout the procedure,                         the patient's blood pressure, pulse, and oxygen  saturations were monitored continuously. The                         Colonoscope was introduced through the anus and                         advanced to the the cecum, identified by appendiceal                         orifice and ileocecal valve. The colonoscopy was                         performed without difficulty. The patient tolerated                         the procedure well. The quality of the bowel                         preparation was good. Findings:      The perianal and digital rectal examinations were normal.      A 3 mm polyp was found in the sigmoid colon. The polyp was sessile. The       polyp was removed with a cold snare.  Resection and retrieval were       complete. Estimated blood loss was minimal.      A few small-mouthed diverticula were found in the sigmoid colon.      Two hyperplastic polyps were found in the rectum. The polyps were 3 to 4       mm in size. These polyps were removed with a cold snare. Resection and       retrieval were complete. Estimated blood loss was minimal. There       appeared to be several other small hyperplastic polyps in the rectum,       these were not removed. If the two polyps in the rectum come back as       adenomatous then would need flex sig for removal of any hyperplastic       appearing polyps.      Internal hemorrhoids were found during retroflexion. The hemorrhoids       were Grade I (internal hemorrhoids that do not prolapse).      The exam was otherwise without abnormality on direct and retroflexion       views. Impression:            - One 3 mm polyp in the sigmoid colon, removed with a                         cold snare. Resected and retrieved.                        - Diverticulosis in the sigmoid colon.                        - Two 3 to 4 mm polyps in the rectum, removed with a                         cold snare. Resected and retrieved.                        -  Internal hemorrhoids.                        - The examination was otherwise normal on direct and                         retroflexion views. Recommendation:        - Discharge patient to home.                        - Resume previous diet.                        - Continue present medications.                        - Await pathology results.                        - Repeat colonoscopy for surveillance based on                         pathology results.                        - Return to referring physician as previously                         scheduled. Procedure Code(s):     --- Professional ---                        [phone], Colonoscopy, flexible; with removal of                          tumor(s), polyp(s), or other lesion(s) by snare                         technique Diagnosis Code(s):     --- Professional ---                        Z86.010, Personal history of colonic polyps                        K63.5, Polyp of colon                        K64.0, First degree hemorrhoids                        K62.1, Rectal polyp                        K57.30, Diverticulosis of large intestine without                         perforation or abscess without bleeding CPT copyright 2019 American [employer] Association. All rights reserved. The codes documented in this report are preliminary and upon coder review may  be revised to meet current compliance requirements. Andrey Farmer MD, MD 02/27/2022 11:23:01 AM Number of Addenda: 0 Note Initiated On: 02/27/2022 10:43 AM Scope Withdrawal Time: 0 hours 10 minutes 8 seconds  Total Procedure Duration: 0 hours 19 minutes  54 seconds  Estimated Blood Loss:  Estimated blood loss was minimal.      [facility]

## 2022-02-27 NOTE — Anesthesia Preprocedure Evaluation (Signed)
Anesthesia Evaluation  Patient identified by MRN, date of birth, ID band Patient awake    Reviewed: Allergy & Precautions, NPO status , Patient's Chart, lab work & pertinent test results  History of Anesthesia Complications (+) PROLONGED EMERGENCE, Emergence Delirium and history of anesthetic complications  Airway Mallampati: III  TM Distance: <3 FB Neck ROM: full    Dental  (+) Chipped   Pulmonary neg shortness of breath, former smoker,    Pulmonary exam normal        Cardiovascular Exercise Tolerance: Good (-) anginanegative cardio ROS Normal cardiovascular exam     Neuro/Psych  Headaches, Anxiety TIA Neuromuscular disease negative psych ROS   GI/Hepatic negative GI ROS, Neg liver ROS,   Endo/Other  Hypothyroidism   Renal/GU negative Renal ROS  negative genitourinary   Musculoskeletal   Abdominal   Peds  Hematology negative hematology ROS (+)   Anesthesia Other Findings Past Medical History: No date: Cancer ([place])     Comment:  skin ca No date: Complication of anesthesia     Comment:  Slow to wake, gets the "shakes" No date: Fibromyalgia No date: Hypothyroidism No date: ICAO (internal carotid artery occlusion)     Comment:  left No date: Migraine headache     Comment:  approx 4-5x/yr No date: Motion sickness     Comment:  boats No date: TIA (transient ischemic attack)     Comment:  No deficits  Past Surgical History: No date: ABDOMINAL HYSTERECTOMY 1995: BREAST BIOPSY; Left     Comment:  neg 1993: BREAST SURGERY; Left     Comment:  Left lumpectomy/partial mastectomy 03/07/2021: CATARACT EXTRACTION W/PHACO; Left     Comment:  Procedure: CATARACT EXTRACTION PHACO AND INTRAOCULAR               LENS PLACEMENT ([place]) LEFT;  Surgeon: Birder Robson,               MD;  Location: [place];  Service:               Ophthalmology;  Laterality: Left;  11.77 1:23.9 03/21/2021: CATARACT EXTRACTION  W/PHACO; Right     Comment:  Procedure: CATARACT EXTRACTION PHACO AND INTRAOCULAR               LENS PLACEMENT (IOC) RIGHT 6.40 00:42.1;  Surgeon:               Birder Robson, MD;  Location: [place];                Service: Ophthalmology;  Laterality: Right; No date: EYE SURGERY No date: foot surgery; Right     Comment:  Right foot ganglion cyst removal No date: TONSILLECTOMY  BMI    Body Mass Index: 25.66 kg/m      Reproductive/Obstetrics negative OB ROS                             Anesthesia Physical Anesthesia Plan  ASA: 3  Anesthesia Plan: General   Post-op Pain Management:    Induction: Intravenous  PONV Risk Score and Plan: Propofol infusion and TIVA  Airway Management Planned: Natural Airway and Nasal Cannula  Additional Equipment:   Intra-op Plan:   Post-operative Plan:   Informed Consent: I have reviewed the patients History and Physical, chart, labs and discussed the procedure including the risks, benefits and alternatives for the proposed anesthesia with the patient or authorized representative who has indicated his/her  understanding and acceptance.     Dental Advisory Given  Plan Discussed with: Anesthesiologist, CRNA and Surgeon  Anesthesia Plan Comments: (Patient consented for risks of anesthesia including but not limited to:  - adverse reactions to medications - risk of airway placement if required - damage to eyes, teeth, lips or other oral mucosa - nerve damage due to positioning  - sore throat or hoarseness - Damage to heart, brain, nerves, lungs, other parts of body or loss of life  Patient voiced understanding.)        Anesthesia Quick Evaluation

## 2022-02-27 NOTE — Anesthesia Postprocedure Evaluation (Signed)
Anesthesia Post Note  Patient: Amy Rangel  Procedure(s) Performed: COLONOSCOPY WITH PROPOFOL  Patient location during evaluation: Endoscopy Anesthesia Type: General Level of consciousness: awake and alert Pain management: pain level controlled Vital Signs Assessment: post-procedure vital signs reviewed and stable Respiratory status: spontaneous breathing, nonlabored ventilation, respiratory function stable and patient connected to nasal cannula oxygen Cardiovascular status: blood pressure returned to baseline and stable Postop Assessment: no apparent nausea or vomiting Anesthetic complications: no   No notable events documented.   Last Vitals:  Vitals:   02/27/22 1122 02/27/22 1132  BP: 125/64 130/65  Pulse: 67 60  Resp: 16 18  Temp: (!) 36.1 C   SpO2: 99% 100%    Last Pain:  Vitals:   02/27/22 1132  TempSrc:   PainSc: 0-No pain                 Precious Haws Analuisa Tudor

## 2022-02-28 ENCOUNTER — Encounter: Payer: Self-pay | Admitting: Gastroenterology

## 2022-02-28 LAB — SURGICAL PATHOLOGY

## 2022-04-30 ENCOUNTER — Other Ambulatory Visit: Payer: Self-pay

## 2022-04-30 ENCOUNTER — Emergency Department
Admission: EM | Admit: 2022-04-30 | Discharge: 2022-04-30 | Disposition: A | Discharge status: Home / Self Care | Payer: Medicare HMO | Attending: Emergency Medicine | Admitting: Emergency Medicine

## 2022-04-30 ENCOUNTER — Emergency Department: Payer: Medicare HMO

## 2022-04-30 DIAGNOSIS — S39012A Strain of muscle, fascia and tendon of lower back, initial encounter: Secondary | ICD-10-CM | POA: Diagnosis not present

## 2022-04-30 DIAGNOSIS — S3992XA Unspecified injury of lower back, initial encounter: Secondary | ICD-10-CM | POA: Diagnosis present

## 2022-04-30 DIAGNOSIS — X58XXXA Exposure to other specified factors, initial encounter: Secondary | ICD-10-CM | POA: Insufficient documentation

## 2022-04-30 DIAGNOSIS — M5441 Lumbago with sciatica, right side: Secondary | ICD-10-CM | POA: Insufficient documentation

## 2022-04-30 DIAGNOSIS — T148XXA Other injury of unspecified body region, initial encounter: Secondary | ICD-10-CM

## 2022-04-30 DIAGNOSIS — M5442 Lumbago with sciatica, left side: Secondary | ICD-10-CM | POA: Diagnosis not present

## 2022-04-30 NOTE — Discharge Instructions (Addendum)
Pain control:  Acetaminophen (tylenol) - You can take 2 extra strength tablets (1000 mg) every 6 hours as needed for pain  Your CT scan did not show any new fractures or dislocation.  CT scans do not show things like nerve impingement or herniated disc.  Follow-up with your pain specialist on Wednesday.  Return to the emergency department for any worsening symptoms.

## 2022-04-30 NOTE — ED Provider Triage Note (Signed)
Emergency Medicine Provider Triage Evaluation Note  Amy Rangel , a 66 y.o. female  was evaluated in triage.  Pt complains of sharp back pain. HX previous surgeries. Increased pain, no trauma. No GI or urinary complaints. No concerning neuro deficits  Review of Systems  Positive: Back pain Negative: Urinary, GI, neuro symptoms  Physical Exam  BP 129/60   Pulse 75   Temp 98.5 F (36.9 C)   Resp 18   Ht '5\' 2"'$  (1.575 m)   Wt 63.5 kg   SpO2 99%   BMI 25.61 kg/m  Gen:   Awake, no distress   Resp:  Normal effort  MSK:   Moves extremities without difficulty  Other:    Medical Decision Making  Medically screening exam initiated at 7:17 PM.  Appropriate orders placed.  MARGE VANDERMEULEN was informed that the remainder of the evaluation will be completed by another provider, this initial triage assessment does not replace that evaluation, and the importance of remaining in the ED until their evaluation is complete.  Low back pain. Xray ordered   Darletta Moll, PA-C 04/30/22 1918

## 2022-04-30 NOTE — ED Provider Notes (Signed)
[facility] Provider Note    Event Date/Time   First MD Initiated Contact with Patient 04/30/22 2038     (approximate)   History   Back Pain   HPI  Amy Rangel is a 66 y.o. female with past medical history significant for back pain, who presents to the emergency department with worsening back pain.  States that over the past 10 days it feels like she has refractured her back.  Has a history of back fracture and is followed by the pain specialist in [place].  Denies any new falls or trauma.  States that she has been trying some mild home medications without significant improvement of the pain.  Denies any weakness or numbness.  Denies any urinary or bowel incontinence.      Physical Exam   Triage Vital Signs: ED Triage Vitals  Enc Vitals Group     BP 04/30/22 1914 129/60     Pulse Rate 04/30/22 1914 75     Resp 04/30/22 1914 18     Temp 04/30/22 1914 98.5 F (36.9 C)     Temp src --      SpO2 04/30/22 1914 99 %     Weight 04/30/22 1915 140 lb (63.5 kg)     Height 04/30/22 1915 '5\' 2"'$  (1.575 m)     Head Circumference --      Peak Flow --      Pain Score 04/30/22 1915 10     Pain Loc --      Pain Edu? --      Excl. in [place]? --     Most recent vital signs: Vitals:   04/30/22 1914 04/30/22 2106  BP: 129/60 124/62  Pulse: 75 70  Resp: 18 19  Temp: 98.5 F (36.9 C) 98.1 F (36.7 C)  SpO2: 99% 98%    Physical Exam Constitutional:      Appearance: She is well-developed.  HENT:     Head: Atraumatic.  Eyes:     Conjunctiva/sclera: Conjunctivae normal.  Cardiovascular:     Rate and Rhythm: Regular rhythm.  Pulmonary:     Effort: No respiratory distress.  Abdominal:     General: There is no distension.  Musculoskeletal:        General: Normal range of motion.     Cervical back: Normal range of motion.     Comments: Mild midline lower lumbar tenderness to palpation.  5/5 strength bilateral lower extremities.  No saddle anesthesia.   Sensation intact.  +2 DP pulses.  Equal bilaterally.  Skin:    General: Skin is warm.  Neurological:     Mental Status: She is alert. Mental status is at baseline.          IMPRESSION / MDM / ASSESSMENT AND PLAN / ED COURSE  I reviewed the triage vital signs and the nursing notes.  66 year old female presents to the emergency department with acute on chronic back pain.  On arrival afebrile, hemodynamically stable.  Differential diagnosis including musculoskeletal strain, acute fracture, pathologic fracture, sciatica.  Clinical picture is not consistent with a cauda equina or epidural compression syndrome.  Do not feel that emergent MRI is necessary at this time.    EKG    RADIOLOGY I independently reviewed imaging, my interpretation of imaging: CT scan of the spine with no acute fracture.  Old fracture present.        ED Results / Procedures / Treatments   Labs (all labs ordered are listed,  but only abnormal results are displayed) Labs interpreted as -    Labs Reviewed - No data to display  Offered pain medication however patient declined.  Patient most likely with acute on chronic pain.  Possible nerve impingement or disc herniation.  Patient will follow-up with her pain specialist this coming Wednesday.  Given return precautions.  Discussed symptomatic treatment.  No questions or concerns at time of discharge.   PROCEDURES:  Critical Care performed: No  Procedures  Patient's presentation is most consistent with acute presentation with potential threat to life or bodily function.   MEDICATIONS ORDERED IN ED: Medications - No data to display  FINAL CLINICAL IMPRESSION(S) / ED DIAGNOSES   Final diagnoses:  Muscle strain  Acute midline low back pain with bilateral sciatica     Rx / DC Orders   ED Discharge Orders     None        Note:  This document was prepared using Dragon voice recognition software and may include unintentional dictation  errors.   Nathaniel Man, MD 04/30/22 2326

## 2022-04-30 NOTE — ED Triage Notes (Signed)
Pt arrives with c/o lower back pain that started about 10 days ago. Pt has hx of lumbar fracture. Pt denies urinary or stool incontinence.

## 2022-06-05 ENCOUNTER — Other Ambulatory Visit: Payer: Self-pay | Admitting: Infectious Diseases

## 2022-06-05 DIAGNOSIS — Z1231 Encounter for screening mammogram for malignant neoplasm of breast: Secondary | ICD-10-CM

## 2022-07-25 ENCOUNTER — Ambulatory Visit
Admission: RE | Admit: 2022-07-25 | Discharge: 2022-07-25 | Disposition: A | Discharge status: Home / Self Care | Payer: Medicare HMO | Source: Ambulatory Visit | Attending: Infectious Diseases | Admitting: Infectious Diseases

## 2022-07-25 DIAGNOSIS — Z1231 Encounter for screening mammogram for malignant neoplasm of breast: Secondary | ICD-10-CM | POA: Diagnosis present

## 2022-07-30 ENCOUNTER — Other Ambulatory Visit: Payer: Self-pay

## 2022-07-30 DIAGNOSIS — Z122 Encounter for screening for malignant neoplasm of respiratory organs: Secondary | ICD-10-CM

## 2022-07-30 DIAGNOSIS — Z87891 Personal history of nicotine dependence: Secondary | ICD-10-CM

## 2022-09-11 ENCOUNTER — Ambulatory Visit (INDEPENDENT_AMBULATORY_CARE_PROVIDER_SITE_OTHER): Payer: Medicare HMO | Admitting: Acute Care

## 2022-09-11 ENCOUNTER — Encounter: Payer: Self-pay | Admitting: Acute Care

## 2022-09-11 DIAGNOSIS — Z87891 Personal history of nicotine dependence: Secondary | ICD-10-CM

## 2022-09-11 NOTE — Progress Notes (Signed)
Virtual Visit via Telephone Note  I connected with Amy Rangel on 09/11/22 at  3:00 PM EDT by telephone and verified that I am speaking with the correct person using two identifiers.  Location: Patient:  At home Provider: [address] [place], [place], [place], [address]    I discussed the limitations, risks, security and privacy concerns of performing an evaluation and management service by telephone and the availability of in person appointments. I also discussed with the patient that there may be a patient responsible charge related to this service. The patient expressed understanding and agreed to proceed.   Shared Decision Making Visit Lung Cancer Screening Program [phone])   Eligibility: Age 67 y.o. Pack Years Smoking History Calculation 30 pack year smoking history (# packs/per year x # years smoked) Recent History of coughing up blood  no Unexplained weight loss? no ( >Than 15 pounds within the last 6 months ) Prior History Lung / other cancer no (Diagnosis within the last 5 years already requiring surveillance chest CT Scans). Smoking Status Former Smoker Former Smokers: Years since quit:   Quit Date: 2019  Visit Components: Discussion included one or more decision making aids. yes Discussion included risk/benefits of screening. yes Discussion included potential follow up diagnostic testing for abnormal scans. yes Discussion included meaning and risk of over diagnosis. yes Discussion included meaning and risk of False Positives. yes Discussion included meaning of total radiation exposure. yes  Counseling Included: Importance of adherence to annual lung cancer LDCT screening. yes Impact of comorbidities on ability to participate in the program. yes Ability and willingness to under diagnostic treatment. yes  Smoking Cessation Counseling: Current Smokers:  Discussed importance of smoking cessation. yes Information about tobacco cessation classes and interventions  provided to patient. yes Patient provided with "ticket" for LDCT Scan. yes Symptomatic Patient. no  Counseling NA Diagnosis Code: Tobacco Use Z72.0 Asymptomatic Patient yes  Counseling (Intermediate counseling: > three minutes counseling) [id number] Former Smokers:  Discussed the importance of maintaining cigarette abstinence. yes Diagnosis Code: Personal History of Nicotine Dependence. [id number] Information about tobacco cessation classes and interventions provided to patient. Yes Patient provided with "ticket" for LDCT Scan. yes Written Order for Lung Cancer Screening with LDCT placed in Epic. Yes (CT Chest Lung Cancer Screening Low Dose W/O CM) [id number] Z12.2-Screening of respiratory organs Z87.891-Personal history of nicotine dependence  I spent 25 minutes of face to face time/virtual visit time  with Ms.  discussing the risks and benefits of lung cancer screening. We took the time to pause the power point at intervals to allow for questions to be asked and answered to ensure understanding. We discussed that she had taken the single most powerful action possible to decrease her risk of developing lung cancer when she quit smoking. I counseled her to remain smoke free, and to contact me if she ever had the desire to smoke again so that I can provide resources and tools to help support the effort to remain smoke free. We discussed the time and location of the scan, and that either  Doroteo Glassman RN, Joella Prince, RN or I  or I will call / send a letter with the results within  24-72 hours of receiving them. She has the office contact information in the event she needs to speak with me,  she verbalized understanding of all of the above and had no further questions upon leaving the office.     I explained to the patient that there has been a  high incidence of coronary artery disease noted on these exams. I explained that this is a non-gated exam therefore degree or severity cannot be determined. This  patient is not on statin therapy. I have asked the patient to follow-up with their PCP regarding any incidental finding of coronary artery disease and management with diet or medication as they feel is clinically indicated. The patient verbalized understanding of the above and had no further questions.     [name], NP 09/11/2022

## 2022-09-11 NOTE — Patient Instructions (Signed)
Thank you for participating in the [facility]. It was our pleasure to meet you today. We will call you with the results of your scan within the next few days. Your scan will be assigned a Lung RADS category score by the physicians reading the scans.  This Lung RADS score determines follow up scanning.  See below for description of categories, and follow up screening recommendations. We will be in touch to schedule your follow up screening annually or based on recommendations of our providers. We will fax a copy of your scan results to your Primary Care Physician, or the physician who referred you to the program, to ensure they have the results. Please call the office if you have any questions or concerns regarding your scanning experience or results.  Our office number is [phone]. Please speak with Denise Phelps, RN. , or  Denise Buckner RN, They are  our Lung Cancer Screening RN.'s If They are unavailable when you call, Please leave a message on the voice mail. We will return your call at our earliest convenience.This voice mail is monitored several times a day.  Remember, if your scan is normal, we will scan you annually as long as you continue to meet the criteria for the program. (Age 50-80, Current smoker or smoker who has quit within the last 15 years). If you are a smoker, remember, quitting is the single most powerful action that you can take to decrease your risk of lung cancer and other pulmonary, breathing related problems. We know quitting is hard, and we are here to help.  Please let us know if there is anything we can do to help you meet your goal of quitting. If you are a former smoker, congratulations. We are proud of you! Remain smoke free! Remember you can refer friends or family members through the number above.  We will screen them to make sure they meet criteria for the program. Thank you for helping us take better care of you by  participating in Lung Screening.  You can receive free nicotine replacement therapy ( patches, gum or mints) by calling 1-800-QUIT NOW. Please call so we can get you on the path to becoming  a non-smoker. I know it is hard, but you can do this!  Lung RADS Categories:  Lung RADS 1: no nodules or definitely non-concerning nodules.  Recommendation is for a repeat annual scan in 12 months.  Lung RADS 2:  nodules that are non-concerning in appearance and behavior with a very low likelihood of becoming an active cancer. Recommendation is for a repeat annual scan in 12 months.  Lung RADS 3: nodules that are probably non-concerning , includes nodules with a low likelihood of becoming an active cancer.  Recommendation is for a 6-month repeat screening scan. Often noted after an upper respiratory illness. We will be in touch to make sure you have no questions, and to schedule your 6-month scan.  Lung RADS 4 A: nodules with concerning findings, recommendation is most often for a follow up scan in 3 months or additional testing based on our provider's assessment of the scan. We will be in touch to make sure you have no questions and to schedule the recommended 3 month follow up scan.  Lung RADS 4 B:  indicates findings that are concerning. We will be in touch with you to schedule additional diagnostic testing based on our provider's  assessment of the scan.  Other options for assistance in smoking cessation (   As covered by your insurance benefits)  Hypnosis for smoking cessation  [employer] [phone]  Acupuncture for smoking cessation  [facility] [phone]   

## 2022-09-12 ENCOUNTER — Ambulatory Visit
Admission: RE | Admit: 2022-09-12 | Discharge: 2022-09-12 | Disposition: A | Discharge status: Home / Self Care | Payer: Medicare HMO | Source: Ambulatory Visit | Attending: Acute Care | Admitting: Acute Care

## 2022-09-12 DIAGNOSIS — Z122 Encounter for screening for malignant neoplasm of respiratory organs: Secondary | ICD-10-CM

## 2022-09-12 DIAGNOSIS — Z87891 Personal history of nicotine dependence: Secondary | ICD-10-CM

## 2022-09-16 ENCOUNTER — Other Ambulatory Visit: Payer: Self-pay | Admitting: Acute Care

## 2022-09-16 DIAGNOSIS — Z87891 Personal history of nicotine dependence: Secondary | ICD-10-CM

## 2022-09-16 DIAGNOSIS — Z122 Encounter for screening for malignant neoplasm of respiratory organs: Secondary | ICD-10-CM

## 2022-12-10 ENCOUNTER — Other Ambulatory Visit: Payer: Self-pay

## 2022-12-10 ENCOUNTER — Observation Stay
Admission: EM | Admit: 2022-12-10 | Discharge: 2022-12-11 | Disposition: A | Discharge status: Home / Self Care | Payer: Medicare HMO | Attending: Osteopathic Medicine | Admitting: Osteopathic Medicine

## 2022-12-10 ENCOUNTER — Emergency Department: Payer: Medicare HMO

## 2022-12-10 ENCOUNTER — Observation Stay (HOSPITAL_BASED_OUTPATIENT_CLINIC_OR_DEPARTMENT_OTHER)
Admit: 2022-12-10 | Discharge: 2022-12-10 | Disposition: A | Discharge status: Home / Self Care | Payer: Medicare HMO | Attending: Family Medicine | Admitting: Family Medicine

## 2022-12-10 ENCOUNTER — Encounter: Payer: Self-pay | Admitting: Emergency Medicine

## 2022-12-10 DIAGNOSIS — I6522 Occlusion and stenosis of left carotid artery: Secondary | ICD-10-CM | POA: Insufficient documentation

## 2022-12-10 DIAGNOSIS — Z85828 Personal history of other malignant neoplasm of skin: Secondary | ICD-10-CM | POA: Insufficient documentation

## 2022-12-10 DIAGNOSIS — Z87891 Personal history of nicotine dependence: Secondary | ICD-10-CM | POA: Diagnosis not present

## 2022-12-10 DIAGNOSIS — M797 Fibromyalgia: Secondary | ICD-10-CM

## 2022-12-10 DIAGNOSIS — Z79899 Other long term (current) drug therapy: Secondary | ICD-10-CM | POA: Diagnosis not present

## 2022-12-10 DIAGNOSIS — E7800 Pure hypercholesterolemia, unspecified: Secondary | ICD-10-CM | POA: Diagnosis not present

## 2022-12-10 DIAGNOSIS — G459 Transient cerebral ischemic attack, unspecified: Secondary | ICD-10-CM

## 2022-12-10 DIAGNOSIS — G43109 Migraine with aura, not intractable, without status migrainosus: Secondary | ICD-10-CM | POA: Diagnosis not present

## 2022-12-10 DIAGNOSIS — R29810 Facial weakness: Principal | ICD-10-CM | POA: Insufficient documentation

## 2022-12-10 DIAGNOSIS — G43009 Migraine without aura, not intractable, without status migrainosus: Secondary | ICD-10-CM | POA: Diagnosis present

## 2022-12-10 DIAGNOSIS — I639 Cerebral infarction, unspecified: Secondary | ICD-10-CM

## 2022-12-10 DIAGNOSIS — G43909 Migraine, unspecified, not intractable, without status migrainosus: Secondary | ICD-10-CM | POA: Insufficient documentation

## 2022-12-10 DIAGNOSIS — Z8669 Personal history of other diseases of the nervous system and sense organs: Secondary | ICD-10-CM

## 2022-12-10 DIAGNOSIS — E039 Hypothyroidism, unspecified: Secondary | ICD-10-CM | POA: Diagnosis not present

## 2022-12-10 LAB — COMPREHENSIVE METABOLIC PANEL
ALT: 20 U/L (ref 0–44)
AST: 28 U/L (ref 15–41)
Albumin: 4.6 g/dL (ref 3.5–5.0)
Alkaline Phosphatase: 131 U/L — ABNORMAL HIGH (ref 38–126)
Anion gap: 11 (ref 5–15)
BUN: 13 mg/dL (ref 8–23)
CO2: 24 mmol/L (ref 22–32)
Calcium: 9.2 mg/dL (ref 8.9–10.3)
Chloride: 106 mmol/L (ref 98–111)
Creatinine, Ser: 0.71 mg/dL (ref 0.44–1.00)
GFR, Estimated: >60 mL/min (ref >60)
Glucose, Bld: 100 mg/dL — ABNORMAL HIGH (ref 70–99)
Potassium: 3.7 mmol/L (ref 3.5–5.1)
Sodium: 141 mmol/L (ref 135–145)
Total Bilirubin: 0.6 mg/dL (ref 0.3–1.2)
Total Protein: 7.6 g/dL (ref 6.5–8.1)

## 2022-12-10 LAB — CBC
HCT: 42.9 % (ref 36.0–46.0)
Hemoglobin: 13.7 g/dL (ref 12.0–15.0)
MCH: 29.1 pg (ref 26.0–34.0)
MCHC: 31.9 g/dL (ref 30.0–36.0)
MCV: 91.3 fL (ref 80.0–100.0)
Platelets: 259 10*3/uL (ref 150–400)
RBC: 4.7 MIL/uL (ref 3.87–5.11)
RDW: 13.4 % (ref 11.5–15.5)
WBC: 6 10*3/uL (ref 4.0–10.5)
nRBC: 0 % (ref 0.0–0.2)

## 2022-12-10 LAB — DIFFERENTIAL
Abs Immature Granulocytes: 0.01 10*3/uL (ref 0.00–0.07)
Basophils Absolute: 0 10*3/uL (ref 0.0–0.1)
Basophils Relative: 0 %
Eosinophils Absolute: 0.1 10*3/uL (ref 0.0–0.5)
Eosinophils Relative: 1 %
Immature Granulocytes: 0 %
Lymphocytes Relative: 43 %
Lymphs Abs: 2.6 10*3/uL (ref 0.7–4.0)
Monocytes Absolute: 0.3 10*3/uL (ref 0.1–1.0)
Monocytes Relative: 6 %
Neutro Abs: 3 10*3/uL (ref 1.7–7.7)
Neutrophils Relative %: 50 %

## 2022-12-10 LAB — URINALYSIS, ROUTINE W REFLEX MICROSCOPIC
Bilirubin Urine: NEGATIVE
Glucose, UA: NEGATIVE mg/dL
Hgb urine dipstick: NEGATIVE
Ketones, ur: NEGATIVE mg/dL
Leukocytes,Ua: NEGATIVE
Nitrite: NEGATIVE
Protein, ur: NEGATIVE mg/dL
Specific Gravity, Urine: 1.003 — ABNORMAL LOW (ref 1.005–1.030)
pH: 8 (ref 5.0–8.0)

## 2022-12-10 LAB — ECHOCARDIOGRAM COMPLETE
AR max vel: 2.36 cm2
AV Area VTI: 2.63 cm2
AV Area mean vel: 2.36 cm2
AV Mean grad: 2 mmHg
AV Peak grad: 5.2 mmHg
Ao pk vel: 1.14 m/s
Area-P 1/2: 3.17 cm2
MV VTI: 2.13 cm2
S' Lateral: 3.1 cm

## 2022-12-10 LAB — URINE DRUG SCREEN, QUALITATIVE (ARMC ONLY)
Amphetamines, Ur Screen: NOT DETECTED
Barbiturates, Ur Screen: POSITIVE — AB
Benzodiazepine, Ur Scrn: NOT DETECTED
Cannabinoid 50 Ng, Ur ~~LOC~~: NOT DETECTED
Cocaine Metabolite,Ur ~~LOC~~: NOT DETECTED
MDMA (Ecstasy)Ur Screen: NOT DETECTED
Methadone Scn, Ur: NOT DETECTED
Opiate, Ur Screen: NOT DETECTED
Phencyclidine (PCP) Ur S: NOT DETECTED
Tricyclic, Ur Screen: NOT DETECTED

## 2022-12-10 LAB — PROTIME-INR
INR: 0.9 (ref 0.8–1.2)
Prothrombin Time: 12 seconds (ref 11.4–15.2)

## 2022-12-10 LAB — ETHANOL: Alcohol, Ethyl (B): <10 mg/dL (ref <10)

## 2022-12-10 LAB — APTT: aPTT: 28 seconds (ref 24–36)

## 2022-12-10 LAB — HEMOGLOBIN A1C
Hgb A1c MFr Bld: 5.2 % (ref 4.8–5.6)
Mean Plasma Glucose: 102.54 mg/dL

## 2022-12-10 MED ORDER — THYROID 60 MG PO TABS: 60.0000 mg | ORAL_TABLET | ORAL | Status: DC

## 2022-12-10 MED ORDER — STROKE: EARLY STAGES OF RECOVERY BOOK: Freq: Once | Status: AC

## 2022-12-10 MED ORDER — ASPIRIN 81 MG PO CHEW
81.0000 mg | CHEWABLE_TABLET | Freq: Every day | ORAL | Status: DC
  Administered 2022-12-11: 81 mg via ORAL
  Filled 2022-12-10: qty 1

## 2022-12-10 MED ORDER — THYROID 30 MG PO TABS
30.0000 mg | ORAL_TABLET | ORAL | Status: DC
  Administered 2022-12-11: 30 mg via ORAL
  Filled 2022-12-10 (×2): qty 1

## 2022-12-10 MED ORDER — TRAMADOL HCL 50 MG PO TABS
50.0000 mg | ORAL_TABLET | Freq: Every day | ORAL | Status: DC | PRN
  Administered 2022-12-11: 50 mg via ORAL
  Filled 2022-12-10: qty 1

## 2022-12-10 MED ORDER — CLONAZEPAM 0.5 MG PO TABS
0.5000 mg | ORAL_TABLET | Freq: Every day | ORAL | Status: DC | PRN
  Filled 2022-12-10: qty 1

## 2022-12-10 MED ORDER — ASPIRIN 325 MG PO TABS
650.0000 mg | ORAL_TABLET | Freq: Once | ORAL | Status: AC
  Administered 2022-12-10: 650 mg via ORAL
  Filled 2022-12-10: qty 2

## 2022-12-10 MED ORDER — CYCLOBENZAPRINE HCL 10 MG PO TABS
5.0000 mg | ORAL_TABLET | Freq: Three times a day (TID) | ORAL | Status: DC
  Administered 2022-12-10 – 2022-12-11 (×2): 5 mg via ORAL
  Filled 2022-12-10 (×2): qty 1

## 2022-12-10 NOTE — Assessment & Plan Note (Signed)
-  Continue Armour Thyroid 

## 2022-12-10 NOTE — Progress Notes (Signed)
  Chaplain On-Call responded to Code Stroke notification at 0746 hours.  Patient returned from CT Scan area and was being assessed by Neurology team in ED room 14.  No family is present.  Chaplain assured Staff of availability as needed.  Chaplain Evelena Peat M.Div., [facility]

## 2022-12-10 NOTE — Progress Notes (Signed)
Telestroke cart was activated at 0800. Per treatment team, pt's LKW was at 0630 with c/o R facial/arm weakness. Dr. Sheria Lang, EDP already assessed patient prior to cart activation. Pt was transported to CT prior to cart activation and returned to room at 0809. Dr. Otelia Limes, Neurologists, was paged for code stroke at 69. Telestroke RN called Dr. Otelia Limes at [phone] and was informed that he was walking to code stroke. Dr. Otelia Limes, was at bedside at [phone] to assess the patient. Based on Dr. Shelbie Hutching assessment, pt does not meet criteria for emergent interventions at this time 2/2 symptoms too mild to treat, with risks of TNK significantly outweighing potential benefits. Provider recommended a STAT MRI/MRA given patient's iodine contrast allergy. Cart 2 died at [phone]. TSRN was able to reconnect to cart 1 at 0825. Pt was transported to MRI at 0826. TSRN contacted Dr. Otelia Limes at [phone] to discuss plan of care. Per Neurologist, no TNK at this time, r/o stroke via STAT MRI/MRA, and ok for TSRN to log off of cart. TSRN informed Neurologist that have team activate code stroke cart again if imaging suggests further treatments. Neurologist to f/u with EDP regarding recommendations.No further needs from Telestroke RN at this time. Telestroke cart disconnected at 0831.

## 2022-12-10 NOTE — Assessment & Plan Note (Signed)
Noted left internal artery occlusion which appears to be chronic on imaging Follow-up neurology recommendations

## 2022-12-10 NOTE — Code Documentation (Signed)
Stroke Response Nurse Documentation Code Documentation  Amy Rangel is a 67 y.o. female arriving to [facility] via [facility] on 12/10/2022 with past medical hx of hypothyroidism, migraines, fibromyalgia, ICAO, TIA, former smoker. On No antithrombotic. Code stroke was activated by EMS.   Patient from home where she was LKW at 0600 and now complaining of right sided facial droop. Patient reports she went to bed at 2200 last night in her normal state. She awoke again around 0400-0500 to use the bathroom, felt normal, and went back to sleep. When she awoke at 0600 to start her day, she felt normal but then started to feel dizzy and nauseous over the next 30 minutes.She noticed shortly after a right facial droop, RUE weakness and her dizziness was then severe enough that she felt as though she was about to fall down, at which time EMS was called.  Stroke team at the bedside on patient arrival. Labs drawn and patient cleared for CT by Dr. Penne Lash. Patient to CT with team. NIHSS 6, see documentation for details and code stroke times. Patient with right facial droop, right arm weakness, right leg weakness, right decreased sensation, and dysarthria  on exam. The following imaging was completed:  CT Head and MRI. Patient is not a candidate for IV Thrombolytic due to. Patient is not a candidate for IR due to no LVO seen on imaging.   Care Plan: q2h NIHSS and vital signs per order.  Swallow screen per order.   Bedside handoff with ED Paramedic Ginny.    Wille Glaser  Stroke Response RN

## 2022-12-10 NOTE — ED Provider Notes (Signed)
[facility] Provider Note    Event Date/Time   First MD Initiated Contact with Patient 12/10/22 [phone]     (approximate)   History   Code Stroke   HPI  Amy Rangel is a 67 y.o. female  here with facial droop and slurred speech.  Pt has h/o migraines, TIA, hypothyroidism, reports acute onset of slurred speech and speech difficulty at around 6-630 this AM. No major focal numbness or weakness. No chest pain. No headache. No vision changes. Reports she has had similar sx in past. No recent tick bites.      Physical Exam   Triage Vital Signs: ED Triage Vitals  Enc Vitals Group     BP      Pulse      Resp      Temp      Temp src      SpO2      Weight      Height      Head Circumference      Peak Flow      Pain Score      Pain Loc      Pain Edu?      Excl. in GC?     Most recent vital signs: Vitals:   12/10/22 1335 12/10/22 1612  BP:  (!) 126/55  Pulse:  69  Resp:  17  Temp: 98.1 F (36.7 C)   SpO2:  97%     General: Awake, no distress.  CV:  Good peripheral perfusion. RRR. Resp:  Normal work of breathing. Lungs clear. Abd:  No distention. No tenderness. Other:  R sided facial droop   ED Results / Procedures / Treatments   Labs (all labs ordered are listed, but only abnormal results are displayed) Labs Reviewed  COMPREHENSIVE METABOLIC PANEL - Abnormal; Notable for the following components:      Result Value   Glucose, Bld 100 (*)    Alkaline Phosphatase 131 (*)    All other components within normal limits  URINE DRUG SCREEN, QUALITATIVE ([facility] ONLY) - Abnormal; Notable for the following components:   Barbiturates, Ur Screen POSITIVE (*)    All other components within normal limits  URINALYSIS, ROUTINE W REFLEX MICROSCOPIC - Abnormal; Notable for the following components:   Color, Urine COLORLESS (*)    APPearance CLEAR (*)    Specific Gravity, Urine 1.003 (*)    All other components within normal limits  ETHANOL   PROTIME-INR  APTT  CBC  DIFFERENTIAL  HEMOGLOBIN A1C  LIPID PANEL     EKG Normal sinus rhythm, VR 69. PR 139, QRS 73, QTc 454. No acute ST elevations. No acute ischemia or infarct.    RADIOLOGY CT Head Code Stroke: No acute abnormality, dense calcification of left ICA - query chronic occlusion   I also independently reviewed and agree with radiologist interpretations.   PROCEDURES:  Critical Care performed: No  .1-3 Lead EKG Interpretation  Performed by: Shaune Pollack, MD Authorized by: Shaune Pollack, MD     Interpretation: normal     ECG rate:  60-80   ECG rate assessment: normal     Rhythm: sinus rhythm     Ectopy: none     Conduction: normal   Comments:     Indication:Stroke like symptoms     MEDICATIONS ORDERED IN ED: Medications  thyroid (ARMOUR) tablet 30 mg (30 mg Oral Not Given 12/10/22 1019)  traMADol (ULTRAM) tablet 50 mg (has no administration in time  range)  clonazePAM (KLONOPIN) tablet 0.5 mg (has no administration in time range)  thyroid (ARMOUR) tablet 60 mg (has no administration in time range)   stroke: early stages of recovery book (has no administration in time range)  aspirin chewable tablet 81 mg (has no administration in time range)  aspirin tablet 650 mg (650 mg Oral Given 12/10/22 1012)     IMPRESSION / MDM / ASSESSMENT AND PLAN / ED COURSE  I reviewed the triage vital signs and the nursing notes.                              Differential diagnosis includes, but is not limited to, TIA, stroke, intracranial mass/lesion, complex migraine, seizure/partial seizure.  Patient's presentation is most consistent with acute presentation with potential threat to life or bodily function.  The patient is on the cardiac monitor to evaluate for evidence of arrhythmia and/or significant heart rate changes  66 yo F here with transient R sided facial droop, RUE weakness. Activated as a CODE STROKE ona rrival. Initial CT Non con shows extensive  calcifications left ICA, recommended CT Angio though allergy to contrast per report. Dr. Otelia Limes at bedside on arrival and throughout stay. Will send for MRI.  MR negative for acute CVA. Will admit for TIA vs complex migraine. Sx slightly improving. Labs show normal renal function, no leukocytosis, no anemia.    FINAL CLINICAL IMPRESSION(S) / ED DIAGNOSES   Final diagnoses:  Acute ischemic stroke (HCC)     Rx / DC Orders   ED Discharge Orders     None        Note:  This document was prepared using Dragon voice recognition software and may include unintentional dictation errors.   Shaune Pollack, MD 12/10/22 [phone]

## 2022-12-10 NOTE — Assessment & Plan Note (Signed)
No active headache Continue home Klonopin and Fioricet as needed

## 2022-12-10 NOTE — ED Triage Notes (Signed)
Code stroke called in field.  Right side facial droop and right arm weakness since 0630.  Alert and oriented.  Concern about leaving her stove onl   I called neighbor and they will go turn stove off.

## 2022-12-10 NOTE — Assessment & Plan Note (Signed)
Acute right-sided weakness as well as facial droop since around 6 AM Code stroke called with CT head negative for any acute findings though chronic left ICA occlusion MRI/MRA of the head and neck grossly stable apart from noted left ICA occlusion Evaluated by neurology Started on aspirin Some?  Transient improvement in right upper extremity weakness at the bedside on my evaluation Continue TIA evaluation including 2D echo, restratification labs Allow for permissive hypertension in setting of TIA evaluation x 24 hours As needed IV labetalol for systolic pressure greater than 220 or diastolic pressure greater than 110 Follow-up formal neurology recommendations.

## 2022-12-10 NOTE — Progress Notes (Signed)
*  PRELIMINARY RESULTS* Echocardiogram 2D Echocardiogram has been performed.  Carolyne Fiscal 12/10/2022, 1:50 PM

## 2022-12-10 NOTE — Consult Note (Signed)
NEURO HOSPITALIST CONSULT NOTE   Requesting physician: Dr. Erma Heritage  Reason for Consult: Acute onset of right facial droop, RUE weakness and dizziness  History obtained from:  Patient and Chart     HPI:                                                                                                                                          Amy Rangel is an 67 y.o. female presenting with acute onset of right facial droop, RUE weakness and dizziness. She has a PMHx of migraines, fibromyalgia, hypothyroidism, severe allergy to iodinated contrast, chronic left internal carotid artery occlusion and 3 TIAs in the past, all without residual deficit, the last of which occurred 6 years ago and was treated with tPA. She states that she went to bed at 10 PM last night feeling normal. She awoke again at about 4-5 AM to use the bathroom, and felt normal then as well. When she awoke at 6 AM to start her day, she felt normal but then started to feel dizzy and nauseous over the next 30 minutes. By 0630 she felt that something was wrong. She felt as though she might be having a migraine, but there was no headache pain. At some point afterwards she started to experience right facial droop, RUE weakness and her dizziness was then severe enough that she felt as though she was about to fall down. She called a neighbor for help and then EMS was called. She is not on any blood thinners.   [facility] ED note from 03/27/18 has been reviewed: "Amy Rangel is a 67 y.o. female with PMH of TIA, fibromyalgia, migraines, who presented to the ER with stroke like symptoms. Described as new onset of RUE and RLE and speech defic its with last known normal at 0900 this AM presented to the ER and was given tPA within 60 minutes of ED presentation and start time ot 10:10 with subsequent vast improvement in her symptoms. States her symptoms were sudden in onset and never were this severe prior. Denies any  recent illnesses, headaches, or recent trauma. States today is the day she is going to quit tobacco. States right side UE and LE still feel weak relative to LUE and LLE but much improved."   MRA report from the above admission: "IMPRESSION: 1. There is no evidence of an acute infarct or acute intracranial abnormality. 2. Redemonstrated left cervical ICA occlusion with reconstitution of the intracranial ICA via anterior and posterior communicating arteries. 3. No significant stenosis of the right ICA by NASCET criteria. Patent bilateral vertebral arteries. 4. Incidental 2 cm left thyroid nodule. Electronically Signed by: Tedra Senegal, MD, [facility] Electronically Signed on: 03/28/2018 8:44 AM"  Past Medical History:  Diagnosis Date  Cancer (HCC)    skin ca   Complication of anesthesia    Slow to wake, gets the "shakes"   Fibromyalgia    Hypothyroidism    ICAO (internal carotid artery occlusion)    left   Migraine headache    approx 4-5x/yr   Motion sickness    boats   TIA (transient ischemic attack)    No deficits    Past Surgical History:  Procedure Laterality Date   ABDOMINAL HYSTERECTOMY     BREAST BIOPSY Left 1995   neg   BREAST SURGERY Left 1993   Left lumpectomy/partial mastectomy   CATARACT EXTRACTION W/PHACO Left 03/07/2021   Procedure: CATARACT EXTRACTION PHACO AND INTRAOCULAR LENS PLACEMENT (IOC) LEFT;  Surgeon: Galen Manila, MD;  Location: [facility];  Service: Ophthalmology;  Laterality: Left;  11.77 1:23.9   CATARACT EXTRACTION W/PHACO Right 03/21/2021   Procedure: CATARACT EXTRACTION PHACO AND INTRAOCULAR LENS PLACEMENT (IOC) RIGHT 6.40 00:42.1;  Surgeon: Galen Manila, MD;  Location: [facility];  Service: Ophthalmology;  Laterality: Right;   COLONOSCOPY WITH PROPOFOL N/A 02/27/2022   Procedure: COLONOSCOPY WITH PROPOFOL;  Surgeon: Regis Bill, MD;  Location: [facility];  Service: Endoscopy;  Laterality: N/A;   EYE SURGERY      foot surgery Right    Right foot ganglion cyst removal   TONSILLECTOMY      Family History  Problem Relation Age of Onset   Hypothyroidism Mother    Breast cancer Mother 3   Leukemia Father              Social History:  reports that she quit smoking about 4 years ago. Her smoking use included cigarettes. She has a 30.00 pack-year smoking history. She has never used smokeless tobacco. She reports that she does not currently use alcohol. She reports that she does not currently use drugs.  Allergies  Allergen Reactions   Bupropion Swelling    Lip swelling    Citrullus Vulgaris Other (See Comments), Rash and Swelling   Gabapentin Other (See Comments) and Shortness Of Breath    "Acid reflux and chest pressure"    Iodinated Contrast Media Anaphylaxis    Uncoded Allergy. Allergen: ct contrast dye (with steroids), Other Reaction: THROAT TIGHTNING   Iodine Other (See Comments)    Other reaction(s): Other (See Comments) Other Reaction: ct contrast & xray dye Other Reaction: ct contrast & xray dye    Iopamidol Other (See Comments)    Other reaction(s): Other (See Comments) Other Reaction: other reaction Other Reaction: other reaction    Levetiracetam Other (See Comments) and Swelling    Facial swelling, upset stomach Swelling of face and felt drunk    Mangifera Indica Rash and Swelling   Neomycin-Polymyxin-Dexameth Rash and Swelling   Papaya Rash and Swelling   Prednisone Anaphylaxis   Sulfa Antibiotics Anaphylaxis and Other (See Comments)    Uncoded Allergy. Allergen: xray dye Uncoded Allergy. Allergen: ct contrast dye (with steroids), Other Reaction: THROAT TIGHTNING    Zolpidem Diarrhea   Teriparatide Other (See Comments)    Patient states that when she took this medication she had a sore throat and was very fatigue. Patient states that when she took this medication she had a sore throat and was very fatigue.    Aluminum Other (See Comments)    Growths Growths     Amoxicillin     Other reaction(s): Unknown Other reaction(s): Unknown    Atorvastatin Diarrhea   Codeine Other (See Comments)  Other reaction(s): Other (See Comments) Other Reaction: NOT ASSESSED Other Reaction: NOT ASSESSED    Diclofenac Potassium Diarrhea   Duloxetine Diarrhea   Duloxetine Hcl     Other reaction(s): Other (See Comments) constipation   Erythromycin Swelling    Swelling of face    Escitalopram Other (See Comments)    "Insomnia" "Insomnia"    Eslicarbazepine Diarrhea and Other (See Comments)    Diarrhea, insomnia   Hepatitis B Vaccine Other (See Comments)    Chest pain and arm pain    Ibuprofen Other (See Comments)    "Stomach upset real bad" "Stomach upset real bad"    Ketoprofen Other (See Comments)    "Stomach upset really bad"    Lanolin Other (See Comments)    Other reaction(s): Other (See Comments) Other Reaction: NOT ASSESSED Other Reaction: NOT ASSESSED    Metoclopramide     error   Midodrine Hcl Other (See Comments)    headache   Monosodium Glutamate     Other reaction(s): Unknown Other reaction(s): Unknown    Morphine Nausea And Vomiting    vomiting   Moxifloxacin Other (See Comments)    Other reaction(s): Other (See Comments) Other Reaction: OTHER REACTION Other Reaction: OTHER REACTION    Moxifloxacin Hcl    Naproxen    Naproxen Sodium Other (See Comments)    Bloody stools Bloody stool    Other     Client reports 35 allergies. Copy of allergies scanned. Garen Lah RN, [facility] Department   Oxycodone    Oxycodone-Acetaminophen Other (See Comments)    Other reaction(s): Other (See Comments) Other Reaction: NOT ASSESSED Other Reaction: NOT ASSESSED    Sulfamethoxazole-Trimethoprim    Trimethoprim    Zonisamide Other (See Comments)    Bladder infection     Lamotrigine Rash   Neomycin-Polymyxin B Gu Rash   Sertraline Rash   Sertraline Hcl Rash   Valproic Acid Nausea Only    MEDICATIONS:                                                                                                                       No current facility-administered medications on file prior to encounter.   Current Outpatient Medications on File Prior to Encounter  Medication Sig Dispense Refill   ARMOUR THYROID 30 MG tablet Take 30 mg by mouth daily.     butalbital-acetaminophen-caffeine (FIORICET) 50-325-40 MG tablet Take by mouth.     Cholecalciferol 50 MCG (2000 UT) TABS Take by mouth.     clonazePAM (KLONOPIN) 0.5 MG tablet Take 0.5 tablets (0.25 mg total) by mouth daily. (Patient taking differently: Take 0.25 mg by mouth daily as needed.) 90 tablet 0   cyclobenzaprine (FLEXERIL) 5 MG tablet Take 5 mg by mouth 3 (three) times daily.     GNP GARLIC EXTRACT PO Take by mouth.     Magnesium Gluconate 550 MG TABS Take by mouth.     Omega 3 1000 MG CAPS Take by mouth.  Riboflavin (B-2-400 PO) Take by mouth.     traMADol (ULTRAM) 50 MG tablet Take 50 mg by mouth daily as needed.      Turmeric Curcumin 500 MG CAPS Take by mouth.     Zinc Sulfate 66 MG TABS Take by mouth.       ROS:                                                                                                                                       As per HPI. Detailed ROS deferred in the context of acuity of presentation.   There were no vitals taken for this visit.  There were no vitals taken for this visit.   General Examination:                                                                                                       Physical Exam HEENT- [place]/AT   Lungs- Respirations unlabored Extremities- Warm and well-perfused  Neurological Examination Mental Status: Awake. Initially with apparent decreased level of alertness while being able to answer all orientation questions correctly (oriented x 5) and reply to questions with fluent speech. Throughout this she maintains a drowsy-like affect. After engaging with neurologist during exam affect  transitions to one consistent with full level of alertness. Naming and comprehension intact. No dysarthria.  Cranial Nerves:  II: Temporal visual fields intact with no extinction to DSS. PERRL. III,IV, VI: No ptosis. EOMI. No nystagmus. V: FT sensation subjectively mildly decreased on the right VII: Right side of mouth does not move when speaking, moves slightly during grimacing and shows the most contraction when puffing cheeks with no air leakage from right side of mouth; the quality of the facial droop is atypical with no flaccidity noted. Upper quadrant of right face with normal strength. Left side of face is normal.  VIII: Hearing intact to voice IX,X: No hypophonia or hoarseness XI: Symmetric. Head is midline.  XII: Midline tongue extension Motor: RUE: 4/5 proximally and distally with giveway and labored affect when contracting against resistance. Subtle bobbing drift.  LUE: 4+/5 with giveway  RLE: 4/5 with effortful affect against resistance. Bobbing drift.  LLE: 4/5 with effortful affect against resistance. Bobbing drift.  Sensory: FT sensation subjectively mildly decreased to RUE. No extinction to DSS.  Deep Tendon Reflexes: 2+ and symmetric bilateral biceps, brachioradialis and patellae Cerebellar: No ataxia disproportionate to apparent weakness with FNF or H-S bilaterally Gait: Deferred  NIHSS: 4   Lab Results:  Basic Metabolic Panel: No results for input(s): "NA", "K", "CL", "CO2", "GLUCOSE", "BUN", "CREATININE", "CALCIUM", "MG", "PHOS" in the last 168 hours.  CBC: Recent Labs  Lab 12/10/22 0814  WBC 6.0  NEUTROABS 3.0  HGB 13.7  HCT 42.9  MCV 91.3  PLT 259    Cardiac Enzymes: No results for input(s): "CKTOTAL", "CKMB", "CKMBINDEX", "TROPONINI" in the last 168 hours.  Lipid Panel: No results for input(s): "CHOL", "TRIG", "HDL", "CHOLHDL", "VLDL", "LDLCALC" in the last 168 hours.  Imaging: CT HEAD CODE STROKE WO CONTRAST  Result Date: 12/10/2022 CLINICAL DATA:   Code stroke. Neuro deficit, acute, stroke suspected. EXAM: CT HEAD WITHOUT CONTRAST TECHNIQUE: Contiguous axial images were obtained from the base of the skull through the vertex without intravenous contrast. RADIATION DOSE REDUCTION: This exam was performed according to the departmental dose-optimization program which includes automated exposure control, adjustment of the mA and/or kV according to patient size and/or use of iterative reconstruction technique. COMPARISON:  None Available. FINDINGS: Brain: No acute intracranial hemorrhage. Gray-white differentiation is preserved. No hydrocephalus or extra-axial collection. No mass effect or midline shift. Vascular: Extensive calcification along the course of the left internal carotid artery, lacerum and cavernous segments. Skull: No calvarial fracture or suspicious bone lesion. Skull base is unremarkable. Sinuses/Orbits: Unremarkable. Other: None. ASPECTS [facility] Stroke Program Early CT Score) - Ganglionic level infarction (caudate, lentiform nuclei, internal capsule, insula, M1-M3 cortex): 7 - Supraganglionic infarction (M4-M6 cortex): 3 Total score (0-10 with 10 being normal): 10 IMPRESSION: 1. No acute intracranial hemorrhage or evidence of evolving large vessel territory infarct. ASPECT score is 10. 2. Extensive calcification along the course of the left internal carotid artery, lacerum and cavernous segments, concerning for chronic occlusion. CTA of the head and neck is recommended for further characterization. Code stroke imaging results were communicated on 12/10/2022 at 8:17 am to provider Dr. Erma Heritage via telephone, who verbally acknowledged these results. Electronically Signed   By: Orvan Falconer M.D.   On: 12/10/2022 08:18     Assessment: 67 year old female presenting with acute onset of right facial droop, RUE weakness and dizziness - Exam reveals right facial droop that fluctuates during grimacing and cheek puffing, subtle RUE drift that resolves  after 5 minutes, and BLE bobbing drift when elevating against gravity. There is giveway weakness throughout, worse in the BLE and RUE, with mild giveway to LUE. NIHSS 4.  - CT head: No acute intracranial hemorrhage or evidence of evolving large vessel territory infarct. ASPECT score is 10. 2. Extensive calcification along the course of the left internal carotid artery, lacerum and cavernous segments, concerning for chronic occlusion.  - Not a TNK candidate: Symptoms too mild to treat, with risks of TNK significantly outweighing potential benefits.  - Overall presentation is suggestive migraine accompaniment. Conversion disorder versus secondary gain also on the DDx. Stroke is possible but felt to be unlikely; will need to assess further with STAT MRI and MRA - Unable to obtain CTA due to severe contrast allergy  Recommendations: - ASA 650 mg crushed x 1 - STAT MRI brain with MRA of head and neck - Frequent neuro checks.  - May benefit from Compazine 10 mg IV x 1   Addendum: - MRI brain: No acute intracranial abnormality. - MRA head and neck: Occlusion of the left ICA at its origin with reconstitution of the ophthalmic segment. No intracranial large vessel occlusion. - Although her presentation is felt to be most consistent with complicated migraine, TIA is also on the DDx.  She is being admitted to the Hospitalist service for TIA work up to include TTE and cardiac telemetry. Start scheduled ASA at 81 mg po qd. Restart atorvastatin (previously was taking but no longer on her outpatient medications list).  - Permissive HTN x 24 hours.  - IVF   Electronically signed: Dr. Caryl Pina 12/10/2022, 8:36 AM

## 2022-12-10 NOTE — Code Documentation (Signed)
CODE STROKE- PHARMACY COMMUNICATION   Time CODE STROKE called/page received:0738  Time response to CODE STROKE was made (in person): 0751  Time Stroke Kit retrieved from [employer] :not required  Name of Provider/Nurse contacted:Lindzen, MD  Past Medical History:  Diagnosis Date   Cancer (HCC)    skin ca   Complication of anesthesia    Slow to wake, gets the "shakes"   Fibromyalgia    Hypothyroidism    ICAO (internal carotid artery occlusion)    left   Migraine headache    approx 4-5x/yr   Motion sickness    boats   TIA (transient ischemic attack)    No deficits   Prior to Admission medications   Medication Sig Start Date End Date Taking? Authorizing Provider  ARMOUR THYROID 30 MG tablet Take 30 mg by mouth daily. 02/10/21   [provider]  butalbital-acetaminophen-caffeine (FIORICET) 50-325-40 MG tablet Take by mouth. 04/08/19   [provider]  Cholecalciferol 50 MCG (2000 UT) TABS Take by mouth.    [provider]  clonazePAM (KLONOPIN) 0.5 MG tablet Take 0.5 tablets (0.25 mg total) by mouth daily. Patient taking differently: Take 0.25 mg by mouth daily as needed. 04/21/20   Malfi, Jodelle Gross, FNP  cyclobenzaprine (FLEXERIL) 5 MG tablet Take 5 mg by mouth 3 (three) times daily. 01/19/20   [provider]  GNP GARLIC EXTRACT PO Take by mouth.    [provider]  Magnesium Gluconate 550 MG TABS Take by mouth.    [provider]  Omega 3 1000 MG CAPS Take by mouth.    [provider]  Riboflavin (B-2-400 PO) Take by mouth.    [provider]  traMADol (ULTRAM) 50 MG tablet Take 50 mg by mouth daily as needed.  10/12/19   [provider]  Turmeric Curcumin 500 MG CAPS Take by mouth.    [provider]  Zinc Sulfate 66 MG TABS Take by mouth.    [provider]    Lowella Bandy ,PharmD Clinical Pharmacist  12/10/2022  7:56 AM

## 2022-12-10 NOTE — Assessment & Plan Note (Signed)
No active headache Some ? Complex migraine  Continue home Klonopin and Fioricet as needed

## 2022-12-10 NOTE — Assessment & Plan Note (Addendum)
Acute right-sided weakness as well as facial droop since around 6 AM Code stroke called with CT head negative for any acute findings though chronic left ICA occlusion MRI/MRA of the head and neck grossly stable apart from noted left ICA occlusion Evaluated by neurology Started on aspirin Some?  Transient improvement in right upper extremity weakness at the bedside on my evaluation Continue TIA evaluation including 2D echo, restratification labs Follow-up formal neurology recommendations.

## 2022-12-10 NOTE — Assessment & Plan Note (Signed)
Check lipid panel Anticipate statin use though with patient having noted multiple allergies

## 2022-12-10 NOTE — H&P (Addendum)
History and Physical    Patient: Amy Rangel:[id number] DOB: 05-06-1956 DOA: 12/10/2022 DOS: the patient was seen and examined on 12/10/2022 PCP: Mick Sell, MD  Patient coming from: Home  Chief Complaint:  Chief Complaint  Patient presents with   Code Stroke   HPI: Amy Rangel is a 67 y.o. female with medical history significant of remote history of TIA, migraines, history of left internal artery occlusion, hypothyroidism, fibromyalgia presenting with TIA.  Patient reports having generalized dizziness around 6-6 30 this morning.  Started developing progressive right-sided weakness.  Noted baseline history of migraines.  No true headache.  No vision changes.  Symptoms progressed to right-sided facial droop.  No confusion.  Positive mild dysarthria.  No chest pain or shortness of breath.  No nausea or vomiting.  Patient reports remote history of TIA in the past roughly 6 years ago that tPA was used for treatment.  Was previously on aspirin but is no longer taking.  No reported tobacco use.  No reported alcohol use. Presented to the ER afebrile, hemodynamically stable.  Labs grossly stable.  Code stroke called with neurology evaluation.  Patient not felt to be a TNK candidate as symptoms were felt to be too mild to treat.  CT of the head grossly stable apart from chronic left-sided ICA occlusion.  MRI and MRA of the head and neck also grossly stable.  Given aspirin in the ER.  Recommendation for TIA evaluation and admission. Review of Systems: As mentioned in the history of present illness. All other systems reviewed and are negative. Past Medical History:  Diagnosis Date   Cancer (HCC)    skin ca   Complication of anesthesia    Slow to wake, gets the "shakes"   Fibromyalgia    Hypothyroidism    ICAO (internal carotid artery occlusion)    left   Migraine headache    approx 4-5x/yr   Motion sickness    boats   TIA (transient ischemic attack)    No deficits   Past  Surgical History:  Procedure Laterality Date   ABDOMINAL HYSTERECTOMY     BREAST BIOPSY Left 1995   neg   BREAST SURGERY Left 1993   Left lumpectomy/partial mastectomy   CATARACT EXTRACTION W/PHACO Left 03/07/2021   Procedure: CATARACT EXTRACTION PHACO AND INTRAOCULAR LENS PLACEMENT (IOC) LEFT;  Surgeon: Galen Manila, MD;  Location: [facility];  Service: Ophthalmology;  Laterality: Left;  11.77 1:23.9   CATARACT EXTRACTION W/PHACO Right 03/21/2021   Procedure: CATARACT EXTRACTION PHACO AND INTRAOCULAR LENS PLACEMENT (IOC) RIGHT 6.40 00:42.1;  Surgeon: Galen Manila, MD;  Location: [facility];  Service: Ophthalmology;  Laterality: Right;   COLONOSCOPY WITH PROPOFOL N/A 02/27/2022   Procedure: COLONOSCOPY WITH PROPOFOL;  Surgeon: Regis Bill, MD;  Location: [facility];  Service: Endoscopy;  Laterality: N/A;   EYE SURGERY     foot surgery Right    Right foot ganglion cyst removal   TONSILLECTOMY     Social History:  reports that she quit smoking about 4 years ago. Her smoking use included cigarettes. She has a 30.00 pack-year smoking history. She has never used smokeless tobacco. She reports that she does not currently use alcohol. She reports that she does not currently use drugs.  Allergies  Allergen Reactions   Bupropion Swelling    Lip swelling    Citrullus Vulgaris Other (See Comments), Rash and Swelling   Gabapentin Other (See Comments) and Shortness Of Breath    "Acid  reflux and chest pressure"    Iodinated Contrast Media Anaphylaxis    Uncoded Allergy. Allergen: ct contrast dye (with steroids), Other Reaction: THROAT TIGHTNING   Iodine Other (See Comments)    Other reaction(s): Other (See Comments) Other Reaction: ct contrast & xray dye Other Reaction: ct contrast & xray dye    Iopamidol Other (See Comments)    Other reaction(s): Other (See Comments) Other Reaction: other reaction Other Reaction: other reaction    Levetiracetam  Other (See Comments) and Swelling    Facial swelling, upset stomach Swelling of face and felt drunk    Mangifera Indica Rash and Swelling   Neomycin-Polymyxin-Dexameth Rash and Swelling   Papaya Rash and Swelling   Prednisone Anaphylaxis   Sulfa Antibiotics Anaphylaxis and Other (See Comments)    Uncoded Allergy. Allergen: xray dye Uncoded Allergy. Allergen: ct contrast dye (with steroids), Other Reaction: THROAT TIGHTNING    Zolpidem Diarrhea   Teriparatide Other (See Comments)    Patient states that when she took this medication she had a sore throat and was very fatigue. Patient states that when she took this medication she had a sore throat and was very fatigue.    Aluminum Other (See Comments)    Growths Growths    Amoxicillin     Other reaction(s): Unknown Other reaction(s): Unknown    Atorvastatin Diarrhea   Codeine Other (See Comments)    Other reaction(s): Other (See Comments) Other Reaction: NOT ASSESSED Other Reaction: NOT ASSESSED    Diclofenac Potassium Diarrhea   Duloxetine Diarrhea   Duloxetine Hcl     Other reaction(s): Other (See Comments) constipation   Erythromycin Swelling    Swelling of face    Escitalopram Other (See Comments)    "Insomnia" "Insomnia"    Eslicarbazepine Diarrhea and Other (See Comments)    Diarrhea, insomnia   Hepatitis B Vaccine Other (See Comments)    Chest pain and arm pain    Ibuprofen Other (See Comments)    "Stomach upset real bad" "Stomach upset real bad"    Ketoprofen Other (See Comments)    "Stomach upset really bad"    Lanolin Other (See Comments)    Other reaction(s): Other (See Comments) Other Reaction: NOT ASSESSED Other Reaction: NOT ASSESSED    Metoclopramide     error   Midodrine Hcl Other (See Comments)    headache   Monosodium Glutamate     Other reaction(s): Unknown Other reaction(s): Unknown    Morphine Nausea And Vomiting    vomiting   Moxifloxacin Other (See Comments)    Other  reaction(s): Other (See Comments) Other Reaction: OTHER REACTION Other Reaction: OTHER REACTION    Moxifloxacin Hcl    Naproxen    Naproxen Sodium Other (See Comments)    Bloody stools Bloody stool    Other     Client reports 35 allergies. Copy of allergies scanned. Garen Lah RN, [facility]   Oxycodone    Oxycodone-Acetaminophen Other (See Comments)    Other reaction(s): Other (See Comments) Other Reaction: NOT ASSESSED Other Reaction: NOT ASSESSED    Sulfamethoxazole-Trimethoprim    Trimethoprim    Zonisamide Other (See Comments)    Bladder infection     Lamotrigine Rash   Neomycin-Polymyxin B Gu Rash   Sertraline Rash   Sertraline Hcl Rash   Valproic Acid Nausea Only    Family History  Problem Relation Age of Onset   Hypothyroidism Mother    Breast cancer Mother 67   Leukemia Father  Prior to Admission medications   Medication Sig Start Date End Date Taking? Authorizing Provider  ARMOUR THYROID 30 MG tablet Take 30 mg by mouth daily. 02/10/21   [provider]  butalbital-acetaminophen-caffeine (FIORICET) 50-325-40 MG tablet Take by mouth. 04/08/19   [provider]  Cholecalciferol 50 MCG (2000 UT) TABS Take by mouth.    [provider]  clonazePAM (KLONOPIN) 0.5 MG tablet Take 0.5 tablets (0.25 mg total) by mouth daily. Patient taking differently: Take 0.25 mg by mouth daily as needed. 04/21/20   Malfi, Jodelle Gross, FNP  cyclobenzaprine (FLEXERIL) 5 MG tablet Take 5 mg by mouth 3 (three) times daily. 01/19/20   [provider]  GNP GARLIC EXTRACT PO Take by mouth.    [provider]  Magnesium Gluconate 550 MG TABS Take by mouth.    [provider]  Omega 3 1000 MG CAPS Take by mouth.    [provider]  Riboflavin (B-2-400 PO) Take by mouth.    [provider]  traMADol (ULTRAM) 50 MG tablet Take 50 mg by mouth daily as needed.  10/12/19   [provider]  Turmeric  Curcumin 500 MG CAPS Take by mouth.    [provider]  Zinc Sulfate 66 MG TABS Take by mouth.    [provider]    Physical Exam: Vitals:   12/10/22 0845 12/10/22 0931  BP: (!) 142/63 132/71  Pulse:  60  Resp:  18  Temp:  98.1 F (36.7 C)  TempSrc:  Oral  SpO2:  100%   Physical Exam Constitutional:      Appearance: She is normal weight.  HENT:     Head: Normocephalic.     Nose: Nose normal.     Mouth/Throat:     Mouth: Mucous membranes are moist.  Eyes:     Pupils: Pupils are equal, round, and reactive to light.  Cardiovascular:     Rate and Rhythm: Normal rate and regular rhythm.  Pulmonary:     Effort: Pulmonary effort is normal.  Abdominal:     General: Bowel sounds are normal.  Musculoskeletal:        General: Normal range of motion.  Neurological:     Comments: Positive right-sided facial droop Positive right upper extremity and lower extremity weakness. Weakness more predominant in the right lower extremity.  Psychiatric:        Mood and Affect: Mood normal.     Data Reviewed:  There are no new results to review at this time. MR ANGIO NECK WO CONTRAST CLINICAL DATA:  Neuro deficit, acute, stroke suspected. Acute right-sided facial weakness and droop with right arm drift.  EXAM: MRI HEAD WITHOUT CONTRAST  MRA HEAD WITHOUT CONTRAST  MRA NECK WITHOUT CONTRAST  TECHNIQUE: Multiplanar, multi-echo pulse sequences of the brain and surrounding structures were acquired without intravenous contrast. Angiographic images of the Circle of Willis were acquired using MRA technique without intravenous contrast. Angiographic images of the neck were acquired using time-of-flight technique without contrast. Carotid stenosis measurements (when applicable) are obtained utilizing NASCET criteria, using the distal internal carotid diameter as the denominator. 3D post processing, including maximum intensity projections and multiplanar reformations,  was performed for better evaluation of the vasculature.  COMPARISON:  Head CT 12/10/2022.  FINDINGS: MRI HEAD FINDINGS  Brain: No acute infarct or hemorrhage. No mass or midline shift. No hydrocephalus or extra-axial collection. No abnormal susceptibility.  Vascular: Loss of the left ICA flow void.  Skull and upper cervical  spine: Normal marrow signal.  Sinuses/Orbits: Unremarkable.  Other: None.  MRA HEAD FINDINGS  Anterior circulation: Occlusion of the left ICA with reconstitution of the ophthalmic segment. The right ICA is patent without stenosis or aneurysm. The proximal ACAs and MCAs are patent without stenosis or aneurysm. Distal branches are symmetric.  Posterior circulation: Visualized portions of the distal vertebral arteries and basilar artery are patent without stenosis or aneurysm. The SCAs, AICAs and PICAs are patent proximally. The PCAs are patent proximally without stenosis or aneurysm. Distal branches are symmetric.  Anatomic variants: None.  MRA NECK FINDINGS  Aortic arch: Three-vessel arch configuration. Arch vessel origins are patent.  Right carotid system: No evidence of dissection, stenosis (50% or greater), or occlusion.  Left carotid system: Occlusion of the left ICA at its origin.  Vertebral arteries: Codominant. No evidence of dissection, stenosis (50% or greater), or occlusion.  Other: None  IMPRESSION: 1. No acute intracranial abnormality. 2. Occlusion of the left ICA at its origin with reconstitution of the ophthalmic segment. 3. No intracranial large vessel occlusion.  Electronically Signed   By: Orvan Falconer M.D.   On: 12/10/2022 09:32 MR ANGIO HEAD WO CONTRAST CLINICAL DATA:  Neuro deficit, acute, stroke suspected. Acute right-sided facial weakness and droop with right arm drift.  EXAM: MRI HEAD WITHOUT CONTRAST  MRA HEAD WITHOUT CONTRAST  MRA NECK WITHOUT CONTRAST  TECHNIQUE: Multiplanar, multi-echo pulse  sequences of the brain and surrounding structures were acquired without intravenous contrast. Angiographic images of the Circle of Willis were acquired using MRA technique without intravenous contrast. Angiographic images of the neck were acquired using time-of-flight technique without contrast. Carotid stenosis measurements (when applicable) are obtained utilizing NASCET criteria, using the distal internal carotid diameter as the denominator. 3D post processing, including maximum intensity projections and multiplanar reformations, was performed for better evaluation of the vasculature.  COMPARISON:  Head CT 12/10/2022.  FINDINGS: MRI HEAD FINDINGS  Brain: No acute infarct or hemorrhage. No mass or midline shift. No hydrocephalus or extra-axial collection. No abnormal susceptibility.  Vascular: Loss of the left ICA flow void.  Skull and upper cervical spine: Normal marrow signal.  Sinuses/Orbits: Unremarkable.  Other: None.  MRA HEAD FINDINGS  Anterior circulation: Occlusion of the left ICA with reconstitution of the ophthalmic segment. The right ICA is patent without stenosis or aneurysm. The proximal ACAs and MCAs are patent without stenosis or aneurysm. Distal branches are symmetric.  Posterior circulation: Visualized portions of the distal vertebral arteries and basilar artery are patent without stenosis or aneurysm. The SCAs, AICAs and PICAs are patent proximally. The PCAs are patent proximally without stenosis or aneurysm. Distal branches are symmetric.  Anatomic variants: None.  MRA NECK FINDINGS  Aortic arch: Three-vessel arch configuration. Arch vessel origins are patent.  Right carotid system: No evidence of dissection, stenosis (50% or greater), or occlusion.  Left carotid system: Occlusion of the left ICA at its origin.  Vertebral arteries: Codominant. No evidence of dissection, stenosis (50% or greater), or occlusion.  Other: None  IMPRESSION: 1.  No acute intracranial abnormality. 2. Occlusion of the left ICA at its origin with reconstitution of the ophthalmic segment. 3. No intracranial large vessel occlusion.  Electronically Signed   By: Orvan Falconer M.D.   On: 12/10/2022 09:32 MR BRAIN WO CONTRAST CLINICAL DATA:  Neuro deficit, acute, stroke suspected. Acute right-sided facial weakness and droop with right arm drift.  EXAM: MRI HEAD WITHOUT CONTRAST  MRA HEAD WITHOUT CONTRAST  MRA NECK WITHOUT CONTRAST  TECHNIQUE: Multiplanar, multi-echo pulse sequences of the brain and surrounding structures were acquired without intravenous contrast. Angiographic images of the Circle of Willis were acquired using MRA technique without intravenous contrast. Angiographic images of the neck were acquired using time-of-flight technique without contrast. Carotid stenosis measurements (when applicable) are obtained utilizing NASCET criteria, using the distal internal carotid diameter as the denominator. 3D post processing, including maximum intensity projections and multiplanar reformations, was performed for better evaluation of the vasculature.  COMPARISON:  Head CT 12/10/2022.  FINDINGS: MRI HEAD FINDINGS  Brain: No acute infarct or hemorrhage. No mass or midline shift. No hydrocephalus or extra-axial collection. No abnormal susceptibility.  Vascular: Loss of the left ICA flow void.  Skull and upper cervical spine: Normal marrow signal.  Sinuses/Orbits: Unremarkable.  Other: None.  MRA HEAD FINDINGS  Anterior circulation: Occlusion of the left ICA with reconstitution of the ophthalmic segment. The right ICA is patent without stenosis or aneurysm. The proximal ACAs and MCAs are patent without stenosis or aneurysm. Distal branches are symmetric.  Posterior circulation: Visualized portions of the distal vertebral arteries and basilar artery are patent without stenosis or aneurysm. The SCAs, AICAs and PICAs are patent  proximally. The PCAs are patent proximally without stenosis or aneurysm. Distal branches are symmetric.  Anatomic variants: None.  MRA NECK FINDINGS  Aortic arch: Three-vessel arch configuration. Arch vessel origins are patent.  Right carotid system: No evidence of dissection, stenosis (50% or greater), or occlusion.  Left carotid system: Occlusion of the left ICA at its origin.  Vertebral arteries: Codominant. No evidence of dissection, stenosis (50% or greater), or occlusion.  Other: None  IMPRESSION: 1. No acute intracranial abnormality. 2. Occlusion of the left ICA at its origin with reconstitution of the ophthalmic segment. 3. No intracranial large vessel occlusion.  Electronically Signed   By: Orvan Falconer M.D.   On: 12/10/2022 09:32 CT HEAD CODE STROKE WO CONTRAST CLINICAL DATA:  Code stroke. Neuro deficit, acute, stroke suspected.  EXAM: CT HEAD WITHOUT CONTRAST  TECHNIQUE: Contiguous axial images were obtained from the base of the skull through the vertex without intravenous contrast.  RADIATION DOSE REDUCTION: This exam was performed according to the departmental dose-optimization program which includes automated exposure control, adjustment of the mA and/or kV according to patient size and/or use of iterative reconstruction technique.  COMPARISON:  None Available.  FINDINGS: Brain: No acute intracranial hemorrhage. Gray-white differentiation is preserved. No hydrocephalus or extra-axial collection. No mass effect or midline shift.  Vascular: Extensive calcification along the course of the left internal carotid artery, lacerum and cavernous segments.  Skull: No calvarial fracture or suspicious bone lesion. Skull base is unremarkable.  Sinuses/Orbits: Unremarkable.  Other: None.  ASPECTS [facility] Stroke Program Early CT Score)  - Ganglionic level infarction (caudate, lentiform nuclei, internal capsule, insula, M1-M3 cortex): 7  -  Supraganglionic infarction (M4-M6 cortex): 3  Total score (0-10 with 10 being normal): 10  IMPRESSION: 1. No acute intracranial hemorrhage or evidence of evolving large vessel territory infarct. ASPECT score is 10. 2. Extensive calcification along the course of the left internal carotid artery, lacerum and cavernous segments, concerning for chronic occlusion. CTA of the head and neck is recommended for further characterization.  Code stroke imaging results were communicated on 12/10/2022 at 8:17 am to provider Dr. Erma Heritage via telephone, who verbally acknowledged these results.  Electronically Signed   By: Orvan Falconer M.D.   On: 12/10/2022 08:18  Lab Results  Component Value Date   WBC 6.0  12/10/2022   HGB 13.7 12/10/2022   HCT 42.9 12/10/2022   MCV 91.3 12/10/2022   PLT 259 12/10/2022   Last metabolic panel Lab Results  Component Value Date   GLUCOSE 100 (H) 12/10/2022   NA 141 12/10/2022   K 3.7 12/10/2022   CL 106 12/10/2022   CO2 24 12/10/2022   BUN 13 12/10/2022   CREATININE 0.71 12/10/2022   GFRNONAA >60 12/10/2022   CALCIUM 9.2 12/10/2022   PROT 7.6 12/10/2022   ALBUMIN 4.6 12/10/2022   BILITOT 0.6 12/10/2022   ALKPHOS 131 (H) 12/10/2022   AST 28 12/10/2022   ALT 20 12/10/2022   ANIONGAP 11 12/10/2022   Assessment and Plan: * TIA (transient ischemic attack) Acute right-sided weakness as well as facial droop since around 6 AM Code stroke called with CT head negative for any acute findings though chronic left ICA occlusion MRI/MRA of the head and neck grossly stable apart from noted left ICA occlusion Evaluated by neurology Started on aspirin Some?  Transient improvement in right upper extremity weakness at the bedside on my evaluation Continue TIA evaluation including 2D echo, restratification labs Allow for permissive hypertension in setting of TIA evaluation x 24 hours As needed IV labetalol for systolic pressure greater than 220 or diastolic  pressure greater than 110 Follow-up formal neurology recommendations.    Hypothyroidism Continue Armour Thyroid  Migraine without aura and without status migrainosus, not intractable No active headache Some ? Complex migraine  Continue home Klonopin and Fioricet as needed  ICAO (internal carotid artery occlusion), left Noted left internal artery occlusion which appears to be chronic on imaging Follow-up neurology recommendations  Hypercholesterolemia Check lipid panel Anticipate statin use though with patient having noted multiple allergies      Advance Care Planning:   Code Status: Full Code   Consults: Neurology   Family Communication: Family at the bedside   Severity of Illness: The appropriate patient status for this patient is OBSERVATION. Observation status is judged to be reasonable and necessary in order to provide the required intensity of service to ensure the patient's safety. The patient's presenting symptoms, physical exam findings, and initial radiographic and laboratory data in the context of their medical condition is felt to place them at decreased risk for further clinical deterioration. Furthermore, it is anticipated that the patient will be medically stable for discharge from the hospital within 2 midnights of admission.   Author: Floydene Flock, MD 12/10/2022 10:26 AM  For on call review [email or web address].

## 2022-12-10 NOTE — Progress Notes (Signed)
SLP Cancellation Note  Patient Details Name: Amy Rangel MRN: [id number] DOB: 09/07/1955   Cancelled treatment:       Reason Eval/Treat Not Completed: SLP screened, no needs identified, will sign off (chart reviewed; met w/ pt then consulted NSG)  Pt denied any difficulty swallowing and is currently on a regular diet; tolerates swallowing pills w/ water per NSG. Passed her [facility] swallow screen; received lunch tray and was eating w/out difficulty noted at end of visit. Pt conversed in full conversation w/out expressive/receptive deficits noted; pt denied any speech-language deficits. Speech clear. Pt stated her mouth(right side) was "fine now" - slight numb feeling in right side but overall WFL(no anterior leakage, no dysarthria noted).  No further skilled ST services indicated as pt appears at her baseline. Pt agreed. NSG to reconsult if any change in status while admitted.      Jerilynn Som, MS, CCC-SLP Speech Language Pathologist Rehab Services; [facility] [phone] (ascom) Leinaala Catanese 12/10/2022, 2:49 PM

## 2022-12-10 NOTE — Evaluation (Signed)
Occupational Therapy Evaluation Patient Details Name: Amy Rangel MRN: [id number] DOB: [birth year] Today's Date: 12/10/2022   History of Present Illness Amy Rangel is a 67 y.o. female with medical history significant of remote history of TIA, migraines, history of left internal artery occlusion, hypothyroidism, fibromyalgia presenting with TIA (R facial droop and mild R sided weakness).   Clinical Impression   Amy Rangel was seen for OT evaluation this date. Prior to hospital admission, pt was IND including gardening. Pt lives alone. Pt currently requires SUPERVISION toilet t/f, initial HHA imp[roves to IND no assist. IND pericare and standing hand washing. SUPERVISION functional reaching task picking items up from above head / below knee height. Pt reports sensation and mild R hand weakness deficits (4+/5 grip) resolving. Green theraputty provided and HEP reviewed. All education complete, will sign off. Upon hospital discharge, recommend no OT follow up.   Recommendations for follow up therapy are one component of a multi-disciplinary discharge planning process, led by the attending physician.  Recommendations may be updated based on patient status, additional functional criteria and insurance authorization.   Assistance Recommended at Discharge Set up Supervision/Assistance  Patient can return home with the following Help with stairs or ramp for entrance    Functional Status Assessment  Patient has not had a recent decline in their functional status  Equipment Recommendations  None recommended by OT    Recommendations for Other Services       Precautions / Restrictions Precautions Precautions: None Restrictions Weight Bearing Restrictions: No      Mobility Bed Mobility Overal bed mobility: Independent                  Transfers Overall transfer level: Independent Equipment used: None               General transfer comment: initial HHA improving to no AD for  in room mobility      Balance Overall balance assessment: Mild deficits observed, not formally tested                                         ADL either performed or assessed with clinical judgement   ADL Overall ADL's : Needs assistance/impaired                                       General ADL Comments: SUPERVISION toilet t/f. IND pericare and standing hand washing. SUPERVISION functional reaching task picking items up from above head / below knee height.      Pertinent Vitals/Pain Pain Assessment Pain Assessment: No/denies pain     Hand Dominance Right   Extremity/Trunk Assessment Upper Extremity Assessment Upper Extremity Assessment: RUE deficits/detail RUE Deficits / Details: 4+/5 grip, 5 digit opposition intact, mild sensation deficits RUE Sensation: decreased light touch   Lower Extremity Assessment Lower Extremity Assessment: Overall WFL for tasks assessed       Communication Communication Communication: No difficulties   Cognition Arousal/Alertness: Awake/alert Behavior During Therapy: WFL for tasks assessed/performed Overall Cognitive Status: Within Functional Limits for tasks assessed  Home Living Family/patient expects to be discharged to:: Private residence Living Arrangements: Alone Available Help at Discharge: Friend(s);Available PRN/intermittently Type of Home: House Home Access: Stairs to enter [employer] of Steps: 3 Entrance Stairs-Rails: Can reach both Home Layout: One level               Home Equipment: None          Prior Functioning/Environment Prior Level of Function : Independent/Modified Independent;Driving               ADLs Comments: enjoys gardening        OT Problem List: Impaired sensation         OT Goals(Current goals can be found in the care plan section) Acute Rehab OT Goals Patient  Stated Goal: to go home OT Goal Formulation: With patient Time For Goal Achievement: 12/24/22 Potential to Achieve Goals: Good   AM-PAC OT "6 Clicks" Daily Activity     Outcome Measure Help from another person eating meals?: None Help from another person taking care of personal grooming?: None Help from another person toileting, which includes using toliet, bedpan, or urinal?: None Help from another person bathing (including washing, rinsing, drying)?: A Little Help from another person to put on and taking off regular upper body clothing?: None Help from another person to put on and taking off regular lower body clothing?: None 6 Click Score: 23   End of Session    Activity Tolerance: Patient tolerated treatment well Patient left: in bed;with call bell/phone within reach;with family/visitor present  OT Visit Diagnosis: Muscle weakness (generalized) (M62.81)                Time: 5784-6962 OT Time Calculation (min): 13 min Charges:  OT General Charges $OT Visit: 1 Visit OT Evaluation $OT Eval Low Complexity: 1 Low  Kathie Dike, M.S. OTR/L  12/10/22, 2:17 PM  ascom [phone]

## 2022-12-11 DIAGNOSIS — G459 Transient cerebral ischemic attack, unspecified: Secondary | ICD-10-CM | POA: Diagnosis not present

## 2022-12-11 LAB — LIPID PANEL
Cholesterol: 237 mg/dL — ABNORMAL HIGH (ref 0–200)
HDL: 80 mg/dL (ref >40)
LDL Cholesterol: 138 mg/dL — ABNORMAL HIGH (ref 0–99)
Total CHOL/HDL Ratio: 3 RATIO
Triglycerides: 95 mg/dL (ref <150)
VLDL: 19 mg/dL (ref 0–40)

## 2022-12-11 MED ORDER — ASPIRIN 81 MG PO CHEW: 81.0000 mg | CHEWABLE_TABLET | Freq: Every day | ORAL | 0 refills | Status: AC

## 2022-12-11 NOTE — Hospital Course (Addendum)
Amy Rangel is a 67 y.o. female with medical history significant of remote history of TIA, migraines, history of left ICA occlusion, hypothyroidism, fibromyalgia presenting to ED 12/10/2022 w/ generalized dizziness around 6-6:30 this morning.  Started developing progressive right-sided weakness, right-sided facial droop. Patient reports remote history of TIA in the past roughly 6 years ago that tPA was used for treatment. Was previously on aspirin but is no longer taking.  06/10: Code stroke called with neurology evaluation. Patient not felt to be a TNK candidate as symptoms were felt to be too mild to treat. CT of the head grossly stable apart from chronic left-sided ICA occlusion. MRI and MRA of the head and neck also grossly stable. Given aspirin in the ER.  06/11: Echo read as LVEF 55-60 w/ G1DD, RV WNL, LA and RA normal size..  Consultants:  Neurology   Procedures: none      ASSESSMENT & PLAN:   Principal Problem:   TIA (transient ischemic attack) Active Problems:   Hypercholesterolemia   ICAO (internal carotid artery occlusion), left   Migraine without aura and without status migrainosus, not intractable   Hypothyroidism   TIA (transient ischemic attack) vs complex migraine  Restart aspirin Restart atorvastatin  2D echo Risk stratification labs permissive hypertension in setting of TIA evaluation x 24 hours As needed IV labetalol for systolic pressure greater than 220 or diastolic pressure greater than 110  Hypothyroidism Continue Armour Thyroid  Migraine without aura and without status migrainosus, not intractable No active headache Continue home Klonopin and Fioricet as needed  Hypercholesterolemia Check lipid panel Anticipate statin Rx though with patient having noted multiple allergies  ICAO (internal carotid artery occlusion), left chronic on imaging Follow-up neurology recommendations    DVT prophylaxis: *** Pertinent IV fluids/nutrition: *** Central  lines / invasive devices: ***  Code Status: *** ACP documentation reviewed: ***  Current Admission Status: ***  TOC needs / Dispo plan: *** Barriers to discharge / significant pending items: ***

## 2022-12-11 NOTE — Evaluation (Signed)
Physical Therapy Evaluation Patient Details Name: Amy Rangel MRN: [id number] DOB: 18-Jun-1956 Today's Date: 12/11/2022  History of Present Illness  Amy Rangel is a 67 y.o. female with medical history significant of remote history of TIA, migraines, history of left internal artery occlusion, hypothyroidism, fibromyalgia presenting with TIA (R facial droop and mild R sided weakness).  Clinical Impression  Pt is a pleasant 67 year old female who was admitted for a TIA. Pt performs bed mobility, transfers, and ambulation with independently. UE and LE coordination tested with finger to nose and heel to shin testing and is [facility].Patient able to demonstrate standing balance through repeated standing marches x 10 with no UE support or LOB. Patient also performed standing heel raises with no support or LOB. Ambulated with patient for 340 ft with no assistance from SPT. Patient demonstrated step through gait pattern with increased stance time on the left. Adequate arm swing and trunk rotation when ambulating. At this time the patient has no need for continued PT services. If there is a change in status please place another order. Defer to TOC/care team for updates regarding disposition planning.        Recommendations for follow up therapy are one component of a multi-disciplinary discharge planning process, led by the attending physician.  Recommendations may be updated based on patient status, additional functional criteria and insurance authorization.  Follow Up Recommendations       Assistance Recommended at Discharge None  Patient can return home with the following  Other (comment) (No assistance needed at this time.)    Equipment Recommendations None recommended by PT  Recommendations for Other Services       Functional Status Assessment Patient has had a recent decline in their functional status and demonstrates the ability to make significant improvements in function in a reasonable and  predictable amount of time.     Precautions / Restrictions Precautions Precautions: None Restrictions Weight Bearing Restrictions: No      Mobility  Bed Mobility               General bed mobility comments: Not assesed as patient was standing upon entry and left in chair.    Transfers Overall transfer level: Independent Equipment used: None               General transfer comment: No assist for transfers needed.    Ambulation/Gait Ambulation/Gait assistance: Independent Gait Distance (Feet): 240 Feet Assistive device: None Gait Pattern/deviations: Step-through pattern (increased stance time on the left)       General Gait Details: patient demonstrates a step through pattern with increased stance time on the left. adequate arm swing and trunk rotation.  Stairs            Wheelchair Mobility    Modified Rankin (Stroke Patients Only)       Balance Overall balance assessment: No apparent balance deficits (not formally assessed) (patient able to perform weight shifts and march in place with no UE support or loss of balance.)                                           Pertinent Vitals/Pain Pain Assessment Pain Assessment: No/denies pain    Home Living Family/patient expects to be discharged to:: Private residence Living Arrangements: Alone Available Help at Discharge: Friend(s);Available PRN/intermittently Type of Home: House Home Access: Stairs to enter Entrance  Stairs-Rails: Can reach both Entrance Stairs-Number of Steps: 3   Home Layout: One level Home Equipment: None      Prior Function Prior Level of Function : Independent/Modified Independent;Driving                     Hand Dominance        Extremity/Trunk Assessment   Upper Extremity Assessment Upper Extremity Assessment: Overall WFL for tasks assessed    Lower Extremity Assessment Lower Extremity Assessment: Overall WFL for tasks assessed        Communication   Communication: No difficulties  Cognition Arousal/Alertness: Awake/alert Behavior During Therapy: WFL for tasks assessed/performed Overall Cognitive Status: Within Functional Limits for tasks assessed                                          General Comments      Exercises General Exercises - Lower Extremity Hip Flexion/Marching: Standing, AROM, 10 reps, Both Heel Raises: Standing, AROM, Both, 10 reps   Assessment/Plan    PT Assessment Patient does not need any further PT services  PT Problem List         PT Treatment Interventions      PT Goals (Current goals can be found in the Care Plan section)  Acute Rehab PT Goals Patient Stated Goal: To go home PT Goal Formulation: With patient Time For Goal Achievement: 12/25/22 Potential to Achieve Goals: Good    Frequency       Co-evaluation               AM-PAC PT "6 Clicks" Mobility  Outcome Measure Help needed turning from your back to your side while in a flat bed without using bedrails?: None Help needed moving from lying on your back to sitting on the side of a flat bed without using bedrails?: None Help needed moving to and from a bed to a chair (including a wheelchair)?: None Help needed standing up from a chair using your arms (e.g., wheelchair or bedside chair)?: None Help needed to walk in hospital room?: None Help needed climbing 3-5 steps with a railing? : None 6 Click Score: 24    End of Session Equipment Utilized During Treatment: Gait belt Activity Tolerance: Patient tolerated treatment well Patient left: in chair;with call bell/phone within reach Nurse Communication: Mobility status PT Visit Diagnosis: Unsteadiness on feet (R26.81);Other abnormalities of gait and mobility (R26.89);Muscle weakness (generalized) (M62.81)    Time: 1610-9604 PT Time Calculation (min) (ACUTE ONLY): 12 min   Charges:              Malachi Carl, SPT   Malachi Carl 12/11/2022, 11:17 AM

## 2022-12-11 NOTE — Progress Notes (Addendum)
Patient being discharged home. PIV removed. Went over discharge instructions with patient. Patient states that she understands and all questions were answered. Patient going home POV with friend. Patient refused to be escorted out via wheelchair she preferred to walk.

## 2022-12-11 NOTE — Care Management Obs Status (Signed)
MEDICARE OBSERVATION STATUS NOTIFICATION   Patient Details  Name: Amy Rangel MRN: [id number] Date of Birth: 1955-11-19   Medicare Observation Status Notification Given:  Yes    Summerlyn Fickel, LCSW 12/11/2022, 12:46 PM

## 2022-12-11 NOTE — Discharge Summary (Signed)
Physician Discharge Summary   Patient: Amy Rangel MRN: [id number]  DOB: 03-22-1956   Admit:     Date of Admission: 12/10/2022 Admitted from: home   Discharge: Date of discharge: 12/11/22 Disposition: Home Condition at discharge: good  CODE STATUS: FULL CODE     Discharge Physician: Sunnie Nielsen, DO [facility]     PCP: Mick Sell, MD  Recommendations for Outpatient Follow-up:  Follow up with PCP Mick Sell, MD in 1-2 weeks Please obtain labs/tests: CBC, CMP, lipid panel in 4-6 weeks Please follow up on the following pending results: none PCP AND OTHER OUTPATIENT PROVIDERS: SEE BELOW FOR SPECIFIC DISCHARGE INSTRUCTIONS PRINTED FOR PATIENT IN ADDITION TO GENERIC AVS PATIENT INFO    Discharge Instructions     Ambulatory referral to Neurology   Complete by: As directed    An appointment is requested in approximately: 4 weeks   Diet - low sodium heart healthy   Complete by: As directed    Increase activity slowly   Complete by: As directed          Discharge Diagnoses: Principal Problem:   TIA (transient ischemic attack) Active Problems:   Hypercholesterolemia   ICAO (internal carotid artery occlusion), left   Migraine without aura and without status migrainosus, not intractable   Hypothyroidism       Hospital Course: Amy Rangel is a 67 y.o. female with medical history significant of remote history of TIA, migraines, history of left ICA occlusion, hypothyroidism, fibromyalgia presenting to ED 12/10/2022 w/ generalized dizziness around 6-6:30 this morning.  Started developing progressive right-sided weakness, right-sided facial droop. Patient reports remote history of TIA in the past roughly 6 years ago that tPA was used for treatment. Was previously on aspirin but is no longer taking.  06/10: Code stroke called with neurology evaluation. Patient not felt to be a TNK candidate as symptoms were felt to be too mild to treat.  CT of the head grossly stable apart from chronic left-sided ICA occlusion. MRI and MRA of the head and neck also grossly stable. Given aspirin in the ER.  06/11: Echo read as LVEF 55-60 w/ G1DD, RV WNL, LA and RA normal size. Per neurology, ok to d/c home w/ outpatient f/u. Restart ASA. Pt declined statin after discussion risks/benefits.   Consultants:  Neurology   Procedures: none      ASSESSMENT & PLAN:    TIA (transient ischemic attack) vs complex migraine  Restart aspirin Restart atorvastatin - pt declined   2D echo - no concerns  Risk stratification labs - LDL 138 likely familial hypercholesterolemia, A1C no concerns.   Hypothyroidism Continue Armour Thyroid  Migraine without aura and without status migrainosus, not intractable No active headache Continue home Klonopin and Fioricet as needed  Hypercholesterolemia Declined statin as above   ICAO (internal carotid artery occlusion), left chronic on imaging          Discharge Instructions  Allergies as of 12/11/2022       Reactions   Bupropion Swelling   Lip swelling   Citrullus Vulgaris Other (See Comments), Rash, Swelling   Gabapentin Other (See Comments), Shortness Of Breath   "Acid reflux and chest pressure"   Iodinated Contrast Media Anaphylaxis   Uncoded Allergy. Allergen: ct contrast dye (with steroids), Other Reaction: THROAT TIGHTNING   Iodine Other (See Comments)   Other reaction(s): Other (See Comments) Other Reaction: ct contrast & xray dye Other Reaction: ct contrast & xray dye  Iopamidol Other (See Comments)   Other reaction(s): Other (See Comments) Other Reaction: other reaction Other Reaction: other reaction   Levetiracetam Other (See Comments), Swelling   Facial swelling, upset stomach Swelling of face and felt drunk   Mangifera Indica Rash, Swelling   Neomycin-polymyxin-dexameth Rash, Swelling   Papaya Rash, Swelling   Prednisone Anaphylaxis   Sulfa Antibiotics Anaphylaxis,  Other (See Comments)   Uncoded Allergy. Allergen: xray dye Uncoded Allergy. Allergen: ct contrast dye (with steroids), Other Reaction: THROAT TIGHTNING   Zolpidem Diarrhea   Teriparatide Other (See Comments)   Patient states that when she took this medication she had a sore throat and was very fatigue. Patient states that when she took this medication she had a sore throat and was very fatigue.   Aluminum Other (See Comments)   Growths Growths   Amoxicillin    Other reaction(s): Unknown Other reaction(s): Unknown   Atorvastatin Diarrhea   Codeine Other (See Comments)   Other reaction(s): Other (See Comments) Other Reaction: NOT ASSESSED Other Reaction: NOT ASSESSED   Diclofenac Potassium Diarrhea   Duloxetine Diarrhea   Duloxetine Hcl    Other reaction(s): Other (See Comments) constipation   Erythromycin Swelling   Swelling of face   Escitalopram Other (See Comments)   "Insomnia" "Insomnia"   Eslicarbazepine Diarrhea, Other (See Comments)   Diarrhea, insomnia   Hepatitis B Vaccine Other (See Comments)   Chest pain and arm pain   Ibuprofen Other (See Comments)   "Stomach upset real bad" "Stomach upset real bad"   Ketoprofen Other (See Comments)   "Stomach upset really bad"   Lanolin Other (See Comments)   Other reaction(s): Other (See Comments) Other Reaction: NOT ASSESSED Other Reaction: NOT ASSESSED   Metoclopramide    error   Midodrine Hcl Other (See Comments)   headache   Monosodium Glutamate    Other reaction(s): Unknown Other reaction(s): Unknown   Morphine Nausea And Vomiting   vomiting   Moxifloxacin Other (See Comments)   Other reaction(s): Other (See Comments) Other Reaction: OTHER REACTION Other Reaction: OTHER REACTION   Moxifloxacin Hcl    Naproxen    Naproxen Sodium Other (See Comments)   Bloody stools Bloody stool   Other    Client reports 35 allergies. Copy of allergies scanned. Garen Lah RN, [facility]   Oxycodone     Oxycodone-acetaminophen Other (See Comments)   Other reaction(s): Other (See Comments) Other Reaction: NOT ASSESSED Other Reaction: NOT ASSESSED   Sulfamethoxazole-trimethoprim    Trimethoprim    Zonisamide Other (See Comments)   Bladder infection    Lamotrigine Rash   Neomycin-polymyxin B Gu Rash   Sertraline Rash   Sertraline Hcl Rash   Valproic Acid Nausea Only        Medication List     TAKE these medications    Armour Thyroid 30 MG tablet Generic drug: thyroid Take 30 mg by mouth daily. Take 1 tablet every day except Thursday (2 tablets 60mg )   aspirin 81 MG chewable tablet Chew 1 tablet (81 mg total) by mouth daily. Start taking on: December 12, 2022   B-2-400 PO Take by mouth.   butalbital-acetaminophen-caffeine 50-325-40 MG tablet Commonly known as: FIORICET Take 1 tablet by mouth every 6 (six) hours as needed for migraine.   Cholecalciferol 50 MCG (2000 UT) Tabs Take 2,000 Units by mouth daily.   clonazePAM 0.5 MG tablet Commonly known as: KLONOPIN Take 0.5 tablets (0.25 mg total) by mouth daily. What changed:  when to  take this reasons to take this   cyclobenzaprine 5 MG tablet Commonly known as: FLEXERIL Take 5 mg by mouth 3 (three) times daily.   GNP GARLIC EXTRACT PO Take 1 tablet by mouth daily.   Magnesium Gluconate 550 MG Tabs Take 1 tablet by mouth daily.   Omega 3 1000 MG Caps Take 1 capsule by mouth daily.   QUERCETIN COMPLEX IMMUNE PO Take 1 tablet by mouth 3 (three) times a week.   traMADol 50 MG tablet Commonly known as: ULTRAM Take 50 mg by mouth daily as needed.   triamcinolone ointment 0.1 % Commonly known as: KENALOG Apply 1 Application topically 2 (two) times daily as needed (posion ivy).   Turmeric Curcumin 500 MG Caps Take 1 capsule by mouth daily as needed.   Zinc Sulfate 66 MG Tabs Take 1 tablet by mouth 3 (three) times a week.         Follow-up Information     Mick Sell, MD. Go on 12/13/2022.    Specialty: Infectious Diseases Why: @ 10:45am Contact information: [address] [place] [address] [phone]                 Allergies  Allergen Reactions   Bupropion Swelling    Lip swelling    Citrullus Vulgaris Other (See Comments), Rash and Swelling   Gabapentin Other (See Comments) and Shortness Of Breath    "Acid reflux and chest pressure"    Iodinated Contrast Media Anaphylaxis    Uncoded Allergy. Allergen: ct contrast dye (with steroids), Other Reaction: THROAT TIGHTNING   Iodine Other (See Comments)    Other reaction(s): Other (See Comments) Other Reaction: ct contrast & xray dye Other Reaction: ct contrast & xray dye    Iopamidol Other (See Comments)    Other reaction(s): Other (See Comments) Other Reaction: other reaction Other Reaction: other reaction    Levetiracetam Other (See Comments) and Swelling    Facial swelling, upset stomach Swelling of face and felt drunk    Mangifera Indica Rash and Swelling   Neomycin-Polymyxin-Dexameth Rash and Swelling   Papaya Rash and Swelling   Prednisone Anaphylaxis   Sulfa Antibiotics Anaphylaxis and Other (See Comments)    Uncoded Allergy. Allergen: xray dye Uncoded Allergy. Allergen: ct contrast dye (with steroids), Other Reaction: THROAT TIGHTNING    Zolpidem Diarrhea   Teriparatide Other (See Comments)    Patient states that when she took this medication she had a sore throat and was very fatigue. Patient states that when she took this medication she had a sore throat and was very fatigue.    Aluminum Other (See Comments)    Growths Growths    Amoxicillin     Other reaction(s): Unknown Other reaction(s): Unknown    Atorvastatin Diarrhea   Codeine Other (See Comments)    Other reaction(s): Other (See Comments) Other Reaction: NOT ASSESSED Other Reaction: NOT ASSESSED    Diclofenac Potassium Diarrhea   Duloxetine Diarrhea   Duloxetine Hcl     Other reaction(s): Other (See  Comments) constipation   Erythromycin Swelling    Swelling of face    Escitalopram Other (See Comments)    "Insomnia" "Insomnia"    Eslicarbazepine Diarrhea and Other (See Comments)    Diarrhea, insomnia   Hepatitis B Vaccine Other (See Comments)    Chest pain and arm pain    Ibuprofen Other (See Comments)    "Stomach upset real bad" "Stomach upset real bad"    Ketoprofen Other (See Comments)    "  Stomach upset really bad"    Lanolin Other (See Comments)    Other reaction(s): Other (See Comments) Other Reaction: NOT ASSESSED Other Reaction: NOT ASSESSED    Metoclopramide     error   Midodrine Hcl Other (See Comments)    headache   Monosodium Glutamate     Other reaction(s): Unknown Other reaction(s): Unknown    Morphine Nausea And Vomiting    vomiting   Moxifloxacin Other (See Comments)    Other reaction(s): Other (See Comments) Other Reaction: OTHER REACTION Other Reaction: OTHER REACTION    Moxifloxacin Hcl    Naproxen    Naproxen Sodium Other (See Comments)    Bloody stools Bloody stool    Other     Client reports 35 allergies. Copy of allergies scanned. Garen Lah RN, [facility]   Oxycodone    Oxycodone-Acetaminophen Other (See Comments)    Other reaction(s): Other (See Comments) Other Reaction: NOT ASSESSED Other Reaction: NOT ASSESSED    Sulfamethoxazole-Trimethoprim    Trimethoprim    Zonisamide Other (See Comments)    Bladder infection     Lamotrigine Rash   Neomycin-Polymyxin B Gu Rash   Sertraline Rash   Sertraline Hcl Rash   Valproic Acid Nausea Only     Subjective: pt states weakness has resolved, headache improved, walking and tolerating diet    Discharge Exam: BP (!) 116/51   Pulse 66   Temp 97.9 F (36.6 C)   Resp 16   Ht 5\' 2"  (1.575 m)   Wt 67.1 kg   SpO2 99%   BMI 27.06 kg/m  Physical Exam Constitutional:      General: She is not in acute distress.    Appearance: Normal appearance.  Eyes:      Extraocular Movements: Extraocular movements intact.     Pupils: Pupils are equal, round, and reactive to light.  Cardiovascular:     Rate and Rhythm: Normal rate and regular rhythm.     Heart sounds: No murmur heard. Pulmonary:     Effort: Pulmonary effort is normal.     Breath sounds: Normal breath sounds.  Abdominal:     Palpations: Abdomen is soft.  Musculoskeletal:        General: No swelling.  Neurological:     General: No focal deficit present.     Mental Status: She is alert and oriented to person, place, and time.     Cranial Nerves: No cranial nerve deficit.     Sensory: No sensory deficit.     Motor: No weakness.     Coordination: Coordination normal.     Gait: Gait normal.  Psychiatric:        Mood and Affect: Mood normal.        Behavior: Behavior normal.         The results of significant diagnostics from this hospitalization (including imaging, microbiology, ancillary and laboratory) are listed below for reference.     Microbiology: No results found for this or any previous visit (from the past 240 hour(s)).   Labs: BNP (last 3 results) No results for input(s): "BNP" in the last 8760 hours. Basic Metabolic Panel: Recent Labs  Lab 12/10/22 0814  NA 141  K 3.7  CL 106  CO2 24  GLUCOSE 100*  BUN 13  CREATININE 0.71  CALCIUM 9.2   Liver Function Tests: Recent Labs  Lab 12/10/22 0814  AST 28  ALT 20  ALKPHOS 131*  BILITOT 0.6  PROT 7.6  ALBUMIN 4.6  No results for input(s): "LIPASE", "AMYLASE" in the last 168 hours. No results for input(s): "AMMONIA" in the last 168 hours. CBC: Recent Labs  Lab 12/10/22 0814  WBC 6.0  NEUTROABS 3.0  HGB 13.7  HCT 42.9  MCV 91.3  PLT 259   Cardiac Enzymes: No results for input(s): "CKTOTAL", "CKMB", "CKMBINDEX", "TROPONINI" in the last 168 hours. BNP: Invalid input(s): "POCBNP" CBG: No results for input(s): "GLUCAP" in the last 168 hours. D-Dimer No results for input(s): "DDIMER" in the last  72 hours. Hgb A1c Recent Labs    12/10/22 1434  HGBA1C 5.2   Lipid Profile Recent Labs    12/11/22 0500  CHOL 237*  HDL 80  LDLCALC 138*  TRIG 95  CHOLHDL 3.0   Thyroid function studies No results for input(s): "TSH", "T4TOTAL", "T3FREE", "THYROIDAB" in the last 72 hours.  Invalid input(s): "FREET3" Anemia work up No results for input(s): "VITAMINB12", "FOLATE", "FERRITIN", "TIBC", "IRON", "RETICCTPCT" in the last 72 hours. Urinalysis    Component Value Date/Time   COLORURINE COLORLESS (A) 12/10/2022 0814   APPEARANCEUR CLEAR (A) 12/10/2022 0814   LABSPEC 1.003 (L) 12/10/2022 0814   PHURINE 8.0 12/10/2022 0814   GLUCOSEU NEGATIVE 12/10/2022 0814   HGBUR NEGATIVE 12/10/2022 0814   BILIRUBINUR NEGATIVE 12/10/2022 0814   BILIRUBINUR negative 01/29/2020 1546   KETONESUR NEGATIVE 12/10/2022 0814   PROTEINUR NEGATIVE 12/10/2022 0814   UROBILINOGEN 0.2 01/29/2020 1546   NITRITE NEGATIVE 12/10/2022 0814   LEUKOCYTESUR NEGATIVE 12/10/2022 0814   Sepsis Labs Recent Labs  Lab 12/10/22 0814  WBC 6.0   Microbiology No results found for this or any previous visit (from the past 240 hour(s)). Imaging ECHOCARDIOGRAM COMPLETE  Result Date: 12/10/2022    ECHOCARDIOGRAM REPORT   Patient Name:   Amy Rangel Date of Exam: 12/10/2022 Medical Rec #:  [id number]     Height:       62.0 in Accession #:    [id number]    Weight:       140.0 lb Date of Birth:  Sep 15, 1955     BSA:          1.643 m Patient Age:    66 years      BP:           132/71 mmHg Patient Gender: F             HR:           62 bpm. Exam Location:  [facility] Procedure: 2D Echo, Cardiac Doppler and Color Doppler Indications:     TIA  History:         Patient has no prior history of Echocardiogram examinations.                  TIA; Risk Factors:Dyslipidemia.  Sonographer:     Mikki Harbor Referring Phys:  [phone] Francoise Schaumann [place] Diagnosing Phys: Lorine Bears MD IMPRESSIONS  1. Left ventricular ejection fraction, by estimation,  is 55 to 60%. The left ventricle has normal function. The left ventricle has no regional wall motion abnormalities. Left ventricular diastolic parameters are consistent with Grade I diastolic dysfunction (impaired relaxation).  2. Right ventricular systolic function is normal. The right ventricular size is normal. There is normal pulmonary artery systolic pressure.  3. The mitral valve is normal in structure. No evidence of mitral valve regurgitation. No evidence of mitral stenosis.  4. The aortic valve is normal in structure. Aortic valve regurgitation is not visualized. No aortic stenosis is present.  5. The inferior vena cava is normal in size with greater than 50% respiratory variability, suggesting right atrial pressure of 3 mmHg. FINDINGS  Left Ventricle: Left ventricular ejection fraction, by estimation, is 55 to 60%. The left ventricle has normal function. The left ventricle has no regional wall motion abnormalities. The left ventricular internal cavity size was normal in size. There is  no left ventricular hypertrophy. Left ventricular diastolic parameters are consistent with Grade I diastolic dysfunction (impaired relaxation). Right Ventricle: The right ventricular size is normal. No increase in right ventricular wall thickness. Right ventricular systolic function is normal. There is normal pulmonary artery systolic pressure. The tricuspid regurgitant velocity is 1.89 m/s, and  with an assumed right atrial pressure of 3 mmHg, the estimated right ventricular systolic pressure is 17.3 mmHg. Left Atrium: Left atrial size was normal in size. Right Atrium: Right atrial size was normal in size. Pericardium: There is no evidence of pericardial effusion. Mitral Valve: The mitral valve is normal in structure. No evidence of mitral valve regurgitation. No evidence of mitral valve stenosis. MV peak gradient, 3.6 mmHg. The mean mitral valve gradient is 1.0 mmHg. Prominent mitral valve chordae. Tricuspid Valve: The  tricuspid valve is normal in structure. Tricuspid valve regurgitation is trivial. No evidence of tricuspid stenosis. Aortic Valve: The aortic valve is normal in structure. Aortic valve regurgitation is not visualized. No aortic stenosis is present. Aortic valve mean gradient measures 2.0 mmHg. Aortic valve peak gradient measures 5.2 mmHg. Aortic valve area, by VTI measures 2.63 cm. Pulmonic Valve: The pulmonic valve was normal in structure. Pulmonic valve regurgitation is trivial. No evidence of pulmonic stenosis. Aorta: The aortic root is normal in size and structure. Venous: The inferior vena cava is normal in size with greater than 50% respiratory variability, suggesting right atrial pressure of 3 mmHg. IAS/Shunts: No atrial level shunt detected by color flow Doppler.  LEFT VENTRICLE PLAX 2D LVIDd:         4.40 cm   Diastology LVIDs:         3.10 cm   LV e' medial:    8.59 cm/s LV PW:         0.80 cm   LV E/e' medial:  8.0 LV IVS:        0.90 cm   LV e' lateral:   10.60 cm/s LVOT diam:     1.90 cm   LV E/e' lateral: 6.5 LV SV:         67 LV SV Index:   41 LVOT Area:     2.84 cm  RIGHT VENTRICLE RV Basal diam:  3.50 cm RV Mid diam:    2.50 cm RV S prime:     11.70 cm/s TAPSE (M-mode): 2.2 cm LEFT ATRIUM             Index        RIGHT ATRIUM           Index LA diam:        3.30 cm 2.01 cm/m   RA Area:     11.90 cm LA Vol (A2C):   43.3 ml 26.36 ml/m  RA Volume:   25.70 ml  15.64 ml/m LA Vol (A4C):   27.2 ml 16.56 ml/m LA Biplane Vol: 36.5 ml 22.22 ml/m  AORTIC VALVE                    PULMONIC VALVE AV Area (Vmax):    2.36 cm  PV Vmax:       0.88 m/s AV Area (Vmean):   2.36 cm     PV Peak grad:  3.1 mmHg AV Area (VTI):     2.63 cm AV Vmax:           114.00 cm/s AV Vmean:          68.900 cm/s AV VTI:            0.254 m AV Peak Grad:      5.2 mmHg AV Mean Grad:      2.0 mmHg LVOT Vmax:         95.00 cm/s LVOT Vmean:        57.300 cm/s LVOT VTI:          0.236 m LVOT/AV VTI ratio: 0.93  AORTA Ao Root  diam: 3.10 cm MITRAL VALVE               TRICUSPID VALVE MV Area (PHT): 3.17 cm    TR Peak grad:   14.3 mmHg MV Area VTI:   2.13 cm    TR Vmax:        189.00 cm/s MV Peak grad:  3.6 mmHg MV Mean grad:  1.0 mmHg    SHUNTS MV Vmax:       0.95 m/s    Systemic VTI:  0.24 m MV Vmean:      47.2 cm/s   Systemic Diam: 1.90 cm MV Decel Time: 239 msec MV E velocity: 68.70 cm/s MV A velocity: 91.20 cm/s MV E/A ratio:  0.75 Lorine Bears MD Electronically signed by Lorine Bears MD Signature Date/Time: 12/10/2022/2:08:07 PM    Final    MR BRAIN WO CONTRAST  Result Date: 12/10/2022 CLINICAL DATA:  Neuro deficit, acute, stroke suspected. Acute right-sided facial weakness and droop with right arm drift. EXAM: MRI HEAD WITHOUT CONTRAST MRA HEAD WITHOUT CONTRAST MRA NECK WITHOUT CONTRAST TECHNIQUE: Multiplanar, multi-echo pulse sequences of the brain and surrounding structures were acquired without intravenous contrast. Angiographic images of the Circle of Willis were acquired using MRA technique without intravenous contrast. Angiographic images of the neck were acquired using time-of-flight technique without contrast. Carotid stenosis measurements (when applicable) are obtained utilizing NASCET criteria, using the distal internal carotid diameter as the denominator. 3D post processing, including maximum intensity projections and multiplanar reformations, was performed for better evaluation of the vasculature. COMPARISON:  Head CT 12/10/2022. FINDINGS: MRI HEAD FINDINGS Brain: No acute infarct or hemorrhage. No mass or midline shift. No hydrocephalus or extra-axial collection. No abnormal susceptibility. Vascular: Loss of the left ICA flow void. Skull and upper cervical spine: Normal marrow signal. Sinuses/Orbits: Unremarkable. Other: None. MRA HEAD FINDINGS Anterior circulation: Occlusion of the left ICA with reconstitution of the ophthalmic segment. The right ICA is patent without stenosis or aneurysm. The proximal ACAs and  MCAs are patent without stenosis or aneurysm. Distal branches are symmetric. Posterior circulation: Visualized portions of the distal vertebral arteries and basilar artery are patent without stenosis or aneurysm. The SCAs, AICAs and PICAs are patent proximally. The PCAs are patent proximally without stenosis or aneurysm. Distal branches are symmetric. Anatomic variants: None. MRA NECK FINDINGS Aortic arch: Three-vessel arch configuration. Arch vessel origins are patent. Right carotid system: No evidence of dissection, stenosis (50% or greater), or occlusion. Left carotid system: Occlusion of the left ICA at its origin. Vertebral arteries: Codominant. No evidence of dissection, stenosis (50% or greater), or occlusion. Other: None IMPRESSION: 1. No acute intracranial abnormality. 2. Occlusion of  the left ICA at its origin with reconstitution of the ophthalmic segment. 3. No intracranial large vessel occlusion. Electronically Signed   By: Orvan Falconer M.D.   On: 12/10/2022 09:32   MR ANGIO HEAD WO CONTRAST  Result Date: 12/10/2022 CLINICAL DATA:  Neuro deficit, acute, stroke suspected. Acute right-sided facial weakness and droop with right arm drift. EXAM: MRI HEAD WITHOUT CONTRAST MRA HEAD WITHOUT CONTRAST MRA NECK WITHOUT CONTRAST TECHNIQUE: Multiplanar, multi-echo pulse sequences of the brain and surrounding structures were acquired without intravenous contrast. Angiographic images of the Circle of Willis were acquired using MRA technique without intravenous contrast. Angiographic images of the neck were acquired using time-of-flight technique without contrast. Carotid stenosis measurements (when applicable) are obtained utilizing NASCET criteria, using the distal internal carotid diameter as the denominator. 3D post processing, including maximum intensity projections and multiplanar reformations, was performed for better evaluation of the vasculature. COMPARISON:  Head CT 12/10/2022. FINDINGS: MRI HEAD  FINDINGS Brain: No acute infarct or hemorrhage. No mass or midline shift. No hydrocephalus or extra-axial collection. No abnormal susceptibility. Vascular: Loss of the left ICA flow void. Skull and upper cervical spine: Normal marrow signal. Sinuses/Orbits: Unremarkable. Other: None. MRA HEAD FINDINGS Anterior circulation: Occlusion of the left ICA with reconstitution of the ophthalmic segment. The right ICA is patent without stenosis or aneurysm. The proximal ACAs and MCAs are patent without stenosis or aneurysm. Distal branches are symmetric. Posterior circulation: Visualized portions of the distal vertebral arteries and basilar artery are patent without stenosis or aneurysm. The SCAs, AICAs and PICAs are patent proximally. The PCAs are patent proximally without stenosis or aneurysm. Distal branches are symmetric. Anatomic variants: None. MRA NECK FINDINGS Aortic arch: Three-vessel arch configuration. Arch vessel origins are patent. Right carotid system: No evidence of dissection, stenosis (50% or greater), or occlusion. Left carotid system: Occlusion of the left ICA at its origin. Vertebral arteries: Codominant. No evidence of dissection, stenosis (50% or greater), or occlusion. Other: None IMPRESSION: 1. No acute intracranial abnormality. 2. Occlusion of the left ICA at its origin with reconstitution of the ophthalmic segment. 3. No intracranial large vessel occlusion. Electronically Signed   By: Orvan Falconer M.D.   On: 12/10/2022 09:32   MR ANGIO NECK WO CONTRAST  Result Date: 12/10/2022 CLINICAL DATA:  Neuro deficit, acute, stroke suspected. Acute right-sided facial weakness and droop with right arm drift. EXAM: MRI HEAD WITHOUT CONTRAST MRA HEAD WITHOUT CONTRAST MRA NECK WITHOUT CONTRAST TECHNIQUE: Multiplanar, multi-echo pulse sequences of the brain and surrounding structures were acquired without intravenous contrast. Angiographic images of the Circle of Willis were acquired using MRA technique  without intravenous contrast. Angiographic images of the neck were acquired using time-of-flight technique without contrast. Carotid stenosis measurements (when applicable) are obtained utilizing NASCET criteria, using the distal internal carotid diameter as the denominator. 3D post processing, including maximum intensity projections and multiplanar reformations, was performed for better evaluation of the vasculature. COMPARISON:  Head CT 12/10/2022. FINDINGS: MRI HEAD FINDINGS Brain: No acute infarct or hemorrhage. No mass or midline shift. No hydrocephalus or extra-axial collection. No abnormal susceptibility. Vascular: Loss of the left ICA flow void. Skull and upper cervical spine: Normal marrow signal. Sinuses/Orbits: Unremarkable. Other: None. MRA HEAD FINDINGS Anterior circulation: Occlusion of the left ICA with reconstitution of the ophthalmic segment. The right ICA is patent without stenosis or aneurysm. The proximal ACAs and MCAs are patent without stenosis or aneurysm. Distal branches are symmetric. Posterior circulation: Visualized portions of the distal vertebral arteries and basilar  artery are patent without stenosis or aneurysm. The SCAs, AICAs and PICAs are patent proximally. The PCAs are patent proximally without stenosis or aneurysm. Distal branches are symmetric. Anatomic variants: None. MRA NECK FINDINGS Aortic arch: Three-vessel arch configuration. Arch vessel origins are patent. Right carotid system: No evidence of dissection, stenosis (50% or greater), or occlusion. Left carotid system: Occlusion of the left ICA at its origin. Vertebral arteries: Codominant. No evidence of dissection, stenosis (50% or greater), or occlusion. Other: None IMPRESSION: 1. No acute intracranial abnormality. 2. Occlusion of the left ICA at its origin with reconstitution of the ophthalmic segment. 3. No intracranial large vessel occlusion. Electronically Signed   By: Orvan Falconer M.D.   On: 12/10/2022 09:32   CT  HEAD CODE STROKE WO CONTRAST  Result Date: 12/10/2022 CLINICAL DATA:  Code stroke. Neuro deficit, acute, stroke suspected. EXAM: CT HEAD WITHOUT CONTRAST TECHNIQUE: Contiguous axial images were obtained from the base of the skull through the vertex without intravenous contrast. RADIATION DOSE REDUCTION: This exam was performed according to the departmental dose-optimization program which includes automated exposure control, adjustment of the mA and/or kV according to patient size and/or use of iterative reconstruction technique. COMPARISON:  None Available. FINDINGS: Brain: No acute intracranial hemorrhage. Gray-white differentiation is preserved. No hydrocephalus or extra-axial collection. No mass effect or midline shift. Vascular: Extensive calcification along the course of the left internal carotid artery, lacerum and cavernous segments. Skull: No calvarial fracture or suspicious bone lesion. Skull base is unremarkable. Sinuses/Orbits: Unremarkable. Other: None. ASPECTS [facility] Stroke Program Early CT Score) - Ganglionic level infarction (caudate, lentiform nuclei, internal capsule, insula, M1-M3 cortex): 7 - Supraganglionic infarction (M4-M6 cortex): 3 Total score (0-10 with 10 being normal): 10 IMPRESSION: 1. No acute intracranial hemorrhage or evidence of evolving large vessel territory infarct. ASPECT score is 10. 2. Extensive calcification along the course of the left internal carotid artery, lacerum and cavernous segments, concerning for chronic occlusion. CTA of the head and neck is recommended for further characterization. Code stroke imaging results were communicated on 12/10/2022 at 8:17 am to provider Dr. Erma Heritage via telephone, who verbally acknowledged these results. Electronically Signed   By: Orvan Falconer M.D.   On: 12/10/2022 08:18      Time coordinating discharge: over 30 minutes  SIGNED:  Sunnie Nielsen DO [facility]

## 2022-12-11 NOTE — Plan of Care (Signed)
  Problem: Education: Goal: Knowledge of disease or condition will improve Outcome: Progressing   Problem: Ischemic Stroke/TIA Tissue Perfusion: Goal: Complications of ischemic stroke/TIA will be minimized Outcome: Progressing   Problem: Coping: Goal: Will verbalize positive feelings about self Outcome: Progressing Goal: Will identify appropriate support needs Outcome: Progressing

## 2023-06-24 ENCOUNTER — Other Ambulatory Visit: Payer: Self-pay | Admitting: Infectious Diseases

## 2023-06-24 DIAGNOSIS — Z1231 Encounter for screening mammogram for malignant neoplasm of breast: Secondary | ICD-10-CM

## 2023-07-08 DIAGNOSIS — M542 Cervicalgia: Secondary | ICD-10-CM | POA: Diagnosis not present

## 2023-07-08 DIAGNOSIS — M6281 Muscle weakness (generalized): Secondary | ICD-10-CM | POA: Diagnosis not present

## 2023-07-08 DIAGNOSIS — M79651 Pain in right thigh: Secondary | ICD-10-CM | POA: Diagnosis not present

## 2023-07-08 DIAGNOSIS — S32009A Unspecified fracture of unspecified lumbar vertebra, initial encounter for closed fracture: Secondary | ICD-10-CM | POA: Diagnosis not present

## 2023-07-08 DIAGNOSIS — M797 Fibromyalgia: Secondary | ICD-10-CM | POA: Diagnosis not present

## 2023-07-08 DIAGNOSIS — M5412 Radiculopathy, cervical region: Secondary | ICD-10-CM | POA: Diagnosis not present

## 2023-07-08 DIAGNOSIS — Z79899 Other long term (current) drug therapy: Secondary | ICD-10-CM | POA: Diagnosis not present

## 2023-07-08 DIAGNOSIS — G43711 Chronic migraine without aura, intractable, with status migrainosus: Secondary | ICD-10-CM | POA: Diagnosis not present

## 2023-07-08 DIAGNOSIS — M25562 Pain in left knee: Secondary | ICD-10-CM | POA: Diagnosis not present

## 2023-07-08 DIAGNOSIS — M25561 Pain in right knee: Secondary | ICD-10-CM | POA: Diagnosis not present

## 2023-07-08 DIAGNOSIS — M546 Pain in thoracic spine: Secondary | ICD-10-CM | POA: Diagnosis not present

## 2023-07-08 DIAGNOSIS — M79605 Pain in left leg: Secondary | ICD-10-CM | POA: Diagnosis not present

## 2023-07-10 DIAGNOSIS — M9902 Segmental and somatic dysfunction of thoracic region: Secondary | ICD-10-CM | POA: Diagnosis not present

## 2023-07-10 DIAGNOSIS — M531 Cervicobrachial syndrome: Secondary | ICD-10-CM | POA: Diagnosis not present

## 2023-07-10 DIAGNOSIS — M546 Pain in thoracic spine: Secondary | ICD-10-CM | POA: Diagnosis not present

## 2023-07-10 DIAGNOSIS — M545 Low back pain, unspecified: Secondary | ICD-10-CM | POA: Diagnosis not present

## 2023-07-10 DIAGNOSIS — M9903 Segmental and somatic dysfunction of lumbar region: Secondary | ICD-10-CM | POA: Diagnosis not present

## 2023-07-10 DIAGNOSIS — M9901 Segmental and somatic dysfunction of cervical region: Secondary | ICD-10-CM | POA: Diagnosis not present

## 2023-07-11 DIAGNOSIS — E7800 Pure hypercholesterolemia, unspecified: Secondary | ICD-10-CM | POA: Diagnosis not present

## 2023-07-11 DIAGNOSIS — K5900 Constipation, unspecified: Secondary | ICD-10-CM | POA: Diagnosis not present

## 2023-07-11 DIAGNOSIS — G459 Transient cerebral ischemic attack, unspecified: Secondary | ICD-10-CM | POA: Diagnosis not present

## 2023-07-11 DIAGNOSIS — I6522 Occlusion and stenosis of left carotid artery: Secondary | ICD-10-CM | POA: Diagnosis not present

## 2023-07-11 DIAGNOSIS — K625 Hemorrhage of anus and rectum: Secondary | ICD-10-CM | POA: Diagnosis not present

## 2023-07-18 DIAGNOSIS — G459 Transient cerebral ischemic attack, unspecified: Secondary | ICD-10-CM | POA: Diagnosis not present

## 2023-07-18 DIAGNOSIS — Z0000 Encounter for general adult medical examination without abnormal findings: Secondary | ICD-10-CM | POA: Diagnosis not present

## 2023-07-18 DIAGNOSIS — E039 Hypothyroidism, unspecified: Secondary | ICD-10-CM | POA: Diagnosis not present

## 2023-07-18 DIAGNOSIS — E7800 Pure hypercholesterolemia, unspecified: Secondary | ICD-10-CM | POA: Diagnosis not present

## 2023-07-18 DIAGNOSIS — K589 Irritable bowel syndrome without diarrhea: Secondary | ICD-10-CM | POA: Diagnosis not present

## 2023-07-18 DIAGNOSIS — G43009 Migraine without aura, not intractable, without status migrainosus: Secondary | ICD-10-CM | POA: Diagnosis not present

## 2023-07-18 DIAGNOSIS — K5900 Constipation, unspecified: Secondary | ICD-10-CM | POA: Diagnosis not present

## 2023-07-18 DIAGNOSIS — I6522 Occlusion and stenosis of left carotid artery: Secondary | ICD-10-CM | POA: Diagnosis not present

## 2023-07-24 DIAGNOSIS — M810 Age-related osteoporosis without current pathological fracture: Secondary | ICD-10-CM | POA: Diagnosis not present

## 2023-07-24 DIAGNOSIS — Z8781 Personal history of (healed) traumatic fracture: Secondary | ICD-10-CM | POA: Diagnosis not present

## 2023-07-24 DIAGNOSIS — E890 Postprocedural hypothyroidism: Secondary | ICD-10-CM | POA: Diagnosis not present

## 2023-07-29 ENCOUNTER — Ambulatory Visit
Admission: RE | Admit: 2023-07-29 | Discharge: 2023-07-29 | Disposition: A | Discharge status: Home / Self Care | Payer: PPO | Source: Ambulatory Visit | Attending: Infectious Diseases | Admitting: Infectious Diseases

## 2023-07-29 DIAGNOSIS — Z1231 Encounter for screening mammogram for malignant neoplasm of breast: Secondary | ICD-10-CM | POA: Insufficient documentation

## 2023-08-06 DIAGNOSIS — M9902 Segmental and somatic dysfunction of thoracic region: Secondary | ICD-10-CM | POA: Diagnosis not present

## 2023-08-06 DIAGNOSIS — M9901 Segmental and somatic dysfunction of cervical region: Secondary | ICD-10-CM | POA: Diagnosis not present

## 2023-08-06 DIAGNOSIS — M546 Pain in thoracic spine: Secondary | ICD-10-CM | POA: Diagnosis not present

## 2023-08-06 DIAGNOSIS — M9903 Segmental and somatic dysfunction of lumbar region: Secondary | ICD-10-CM | POA: Diagnosis not present

## 2023-08-06 DIAGNOSIS — M531 Cervicobrachial syndrome: Secondary | ICD-10-CM | POA: Diagnosis not present

## 2023-08-06 DIAGNOSIS — M545 Low back pain, unspecified: Secondary | ICD-10-CM | POA: Diagnosis not present

## 2023-09-04 DIAGNOSIS — M9901 Segmental and somatic dysfunction of cervical region: Secondary | ICD-10-CM | POA: Diagnosis not present

## 2023-09-04 DIAGNOSIS — M545 Low back pain, unspecified: Secondary | ICD-10-CM | POA: Diagnosis not present

## 2023-09-04 DIAGNOSIS — M9902 Segmental and somatic dysfunction of thoracic region: Secondary | ICD-10-CM | POA: Diagnosis not present

## 2023-09-04 DIAGNOSIS — M531 Cervicobrachial syndrome: Secondary | ICD-10-CM | POA: Diagnosis not present

## 2023-09-04 DIAGNOSIS — M546 Pain in thoracic spine: Secondary | ICD-10-CM | POA: Diagnosis not present

## 2023-09-04 DIAGNOSIS — M9903 Segmental and somatic dysfunction of lumbar region: Secondary | ICD-10-CM | POA: Diagnosis not present

## 2023-09-11 ENCOUNTER — Other Ambulatory Visit: Payer: Self-pay | Admitting: Physician Assistant

## 2023-09-11 ENCOUNTER — Ambulatory Visit
Admission: RE | Admit: 2023-09-11 | Discharge: 2023-09-11 | Disposition: A | Discharge status: Home / Self Care | Source: Ambulatory Visit | Attending: Physician Assistant | Admitting: Physician Assistant

## 2023-09-11 DIAGNOSIS — R531 Weakness: Secondary | ICD-10-CM | POA: Diagnosis not present

## 2023-09-11 DIAGNOSIS — R29898 Other symptoms and signs involving the musculoskeletal system: Secondary | ICD-10-CM

## 2023-09-11 DIAGNOSIS — J029 Acute pharyngitis, unspecified: Secondary | ICD-10-CM

## 2023-09-11 DIAGNOSIS — R29810 Facial weakness: Secondary | ICD-10-CM | POA: Diagnosis not present

## 2023-09-11 DIAGNOSIS — I6522 Occlusion and stenosis of left carotid artery: Secondary | ICD-10-CM | POA: Diagnosis not present
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
## 2023-09-13 ENCOUNTER — Ambulatory Visit
Admission: RE | Admit: 2023-09-13 | Discharge: 2023-09-13 | Disposition: A | Discharge status: Home / Self Care | Payer: PPO | Source: Ambulatory Visit | Attending: Acute Care | Admitting: Acute Care

## 2023-09-13 DIAGNOSIS — Z122 Encounter for screening for malignant neoplasm of respiratory organs: Secondary | ICD-10-CM | POA: Diagnosis not present

## 2023-09-13 DIAGNOSIS — Z87891 Personal history of nicotine dependence: Secondary | ICD-10-CM

## 2023-10-02 DIAGNOSIS — M546 Pain in thoracic spine: Secondary | ICD-10-CM | POA: Diagnosis not present

## 2023-10-02 DIAGNOSIS — M545 Low back pain, unspecified: Secondary | ICD-10-CM | POA: Diagnosis not present

## 2023-10-02 DIAGNOSIS — M9902 Segmental and somatic dysfunction of thoracic region: Secondary | ICD-10-CM | POA: Diagnosis not present

## 2023-10-02 DIAGNOSIS — M9901 Segmental and somatic dysfunction of cervical region: Secondary | ICD-10-CM | POA: Diagnosis not present

## 2023-10-02 DIAGNOSIS — M9903 Segmental and somatic dysfunction of lumbar region: Secondary | ICD-10-CM | POA: Diagnosis not present

## 2023-10-02 DIAGNOSIS — M531 Cervicobrachial syndrome: Secondary | ICD-10-CM | POA: Diagnosis not present

## 2023-10-04 DIAGNOSIS — E890 Postprocedural hypothyroidism: Secondary | ICD-10-CM | POA: Diagnosis not present

## 2023-10-09 DIAGNOSIS — E890 Postprocedural hypothyroidism: Secondary | ICD-10-CM | POA: Diagnosis not present

## 2023-10-09 DIAGNOSIS — Z8781 Personal history of (healed) traumatic fracture: Secondary | ICD-10-CM | POA: Diagnosis not present

## 2023-10-09 DIAGNOSIS — M810 Age-related osteoporosis without current pathological fracture: Secondary | ICD-10-CM | POA: Diagnosis not present

## 2023-10-14 ENCOUNTER — Telehealth: Payer: Self-pay | Admitting: Acute Care

## 2023-10-14 ENCOUNTER — Other Ambulatory Visit: Payer: Self-pay

## 2023-10-14 DIAGNOSIS — Z122 Encounter for screening for malignant neoplasm of respiratory organs: Secondary | ICD-10-CM

## 2023-10-14 DIAGNOSIS — Z87891 Personal history of nicotine dependence: Secondary | ICD-10-CM

## 2023-10-14 DIAGNOSIS — F17210 Nicotine dependence, cigarettes, uncomplicated: Secondary | ICD-10-CM

## 2023-10-14 NOTE — Telephone Encounter (Signed)
 Returned call from VM for results of LDCT.  Phone gives fast busy signal but would not connect - unable to leave message

## 2023-10-14 NOTE — Telephone Encounter (Signed)
 Called and left message with radiology reading room that patient left message stating she was upset that it has been a month and no results from LDCT.  Requested results be read if possible so patient can be advised.

## 2023-10-16 DIAGNOSIS — R309 Painful micturition, unspecified: Secondary | ICD-10-CM | POA: Diagnosis not present

## 2023-10-28 DIAGNOSIS — H2640 Unspecified secondary cataract: Secondary | ICD-10-CM | POA: Diagnosis not present

## 2023-10-28 DIAGNOSIS — H26491 Other secondary cataract, right eye: Secondary | ICD-10-CM | POA: Diagnosis not present

## 2023-10-28 DIAGNOSIS — H43813 Vitreous degeneration, bilateral: Secondary | ICD-10-CM | POA: Diagnosis not present

## 2023-10-30 DIAGNOSIS — M545 Low back pain, unspecified: Secondary | ICD-10-CM | POA: Diagnosis not present

## 2023-10-30 DIAGNOSIS — M531 Cervicobrachial syndrome: Secondary | ICD-10-CM | POA: Diagnosis not present

## 2023-10-30 DIAGNOSIS — M9903 Segmental and somatic dysfunction of lumbar region: Secondary | ICD-10-CM | POA: Diagnosis not present

## 2023-10-30 DIAGNOSIS — M9901 Segmental and somatic dysfunction of cervical region: Secondary | ICD-10-CM | POA: Diagnosis not present

## 2023-10-30 DIAGNOSIS — M9902 Segmental and somatic dysfunction of thoracic region: Secondary | ICD-10-CM | POA: Diagnosis not present

## 2023-10-30 DIAGNOSIS — M546 Pain in thoracic spine: Secondary | ICD-10-CM | POA: Diagnosis not present

## 2023-11-07 DIAGNOSIS — M79651 Pain in right thigh: Secondary | ICD-10-CM | POA: Diagnosis not present

## 2023-11-07 DIAGNOSIS — M25561 Pain in right knee: Secondary | ICD-10-CM | POA: Diagnosis not present

## 2023-11-07 DIAGNOSIS — M25562 Pain in left knee: Secondary | ICD-10-CM | POA: Diagnosis not present

## 2023-11-07 DIAGNOSIS — M79605 Pain in left leg: Secondary | ICD-10-CM | POA: Diagnosis not present

## 2023-11-07 DIAGNOSIS — F17200 Nicotine dependence, unspecified, uncomplicated: Secondary | ICD-10-CM | POA: Diagnosis not present

## 2023-11-07 DIAGNOSIS — M6281 Muscle weakness (generalized): Secondary | ICD-10-CM | POA: Diagnosis not present

## 2023-11-07 DIAGNOSIS — R293 Abnormal posture: Secondary | ICD-10-CM | POA: Diagnosis not present

## 2023-11-07 DIAGNOSIS — S32009A Unspecified fracture of unspecified lumbar vertebra, initial encounter for closed fracture: Secondary | ICD-10-CM | POA: Diagnosis not present

## 2023-11-07 DIAGNOSIS — M545 Low back pain, unspecified: Secondary | ICD-10-CM | POA: Diagnosis not present

## 2023-11-07 DIAGNOSIS — M542 Cervicalgia: Secondary | ICD-10-CM | POA: Diagnosis not present

## 2023-11-07 DIAGNOSIS — M797 Fibromyalgia: Secondary | ICD-10-CM | POA: Diagnosis not present

## 2023-11-07 DIAGNOSIS — M546 Pain in thoracic spine: Secondary | ICD-10-CM | POA: Diagnosis not present

## 2023-11-11 DIAGNOSIS — H26492 Other secondary cataract, left eye: Secondary | ICD-10-CM | POA: Diagnosis not present

## 2023-11-11 DIAGNOSIS — H26491 Other secondary cataract, right eye: Secondary | ICD-10-CM | POA: Diagnosis not present

## 2023-11-26 DIAGNOSIS — R29810 Facial weakness: Secondary | ICD-10-CM | POA: Diagnosis not present

## 2023-11-26 DIAGNOSIS — R4789 Other speech disturbances: Secondary | ICD-10-CM | POA: Diagnosis not present

## 2023-11-26 DIAGNOSIS — R519 Headache, unspecified: Secondary | ICD-10-CM | POA: Diagnosis not present

## 2023-11-26 DIAGNOSIS — Z8739 Personal history of other diseases of the musculoskeletal system and connective tissue: Secondary | ICD-10-CM | POA: Diagnosis not present

## 2023-11-26 DIAGNOSIS — M545 Low back pain, unspecified: Secondary | ICD-10-CM | POA: Diagnosis not present

## 2023-11-26 DIAGNOSIS — R5383 Other fatigue: Secondary | ICD-10-CM | POA: Diagnosis not present

## 2023-11-26 DIAGNOSIS — G459 Transient cerebral ischemic attack, unspecified: Secondary | ICD-10-CM | POA: Diagnosis not present
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
## 2023-11-27 DIAGNOSIS — M545 Low back pain, unspecified: Secondary | ICD-10-CM | POA: Diagnosis not present

## 2023-11-27 DIAGNOSIS — M9903 Segmental and somatic dysfunction of lumbar region: Secondary | ICD-10-CM | POA: Diagnosis not present

## 2023-11-27 DIAGNOSIS — M546 Pain in thoracic spine: Secondary | ICD-10-CM | POA: Diagnosis not present

## 2023-11-27 DIAGNOSIS — M9901 Segmental and somatic dysfunction of cervical region: Secondary | ICD-10-CM | POA: Diagnosis not present

## 2023-11-27 DIAGNOSIS — M531 Cervicobrachial syndrome: Secondary | ICD-10-CM | POA: Diagnosis not present

## 2023-11-27 DIAGNOSIS — M9902 Segmental and somatic dysfunction of thoracic region: Secondary | ICD-10-CM | POA: Diagnosis not present

## 2023-12-04 DIAGNOSIS — H26491 Other secondary cataract, right eye: Secondary | ICD-10-CM | POA: Diagnosis not present

## 2023-12-20 DIAGNOSIS — H0289 Other specified disorders of eyelid: Secondary | ICD-10-CM | POA: Diagnosis not present

## 2023-12-20 DIAGNOSIS — H02889 Meibomian gland dysfunction of unspecified eye, unspecified eyelid: Secondary | ICD-10-CM | POA: Diagnosis not present

## 2023-12-24 DIAGNOSIS — M5412 Radiculopathy, cervical region: Secondary | ICD-10-CM | POA: Diagnosis not present

## 2023-12-24 DIAGNOSIS — F17200 Nicotine dependence, unspecified, uncomplicated: Secondary | ICD-10-CM | POA: Diagnosis not present

## 2023-12-24 DIAGNOSIS — Z79899 Other long term (current) drug therapy: Secondary | ICD-10-CM | POA: Diagnosis not present

## 2023-12-24 DIAGNOSIS — S32009A Unspecified fracture of unspecified lumbar vertebra, initial encounter for closed fracture: Secondary | ICD-10-CM | POA: Diagnosis not present

## 2023-12-24 DIAGNOSIS — M6281 Muscle weakness (generalized): Secondary | ICD-10-CM | POA: Diagnosis not present

## 2023-12-24 DIAGNOSIS — M79651 Pain in right thigh: Secondary | ICD-10-CM | POA: Diagnosis not present

## 2023-12-24 DIAGNOSIS — M797 Fibromyalgia: Secondary | ICD-10-CM | POA: Diagnosis not present

## 2023-12-24 DIAGNOSIS — M25561 Pain in right knee: Secondary | ICD-10-CM | POA: Diagnosis not present

## 2023-12-24 DIAGNOSIS — M546 Pain in thoracic spine: Secondary | ICD-10-CM | POA: Diagnosis not present

## 2023-12-24 DIAGNOSIS — M1712 Unilateral primary osteoarthritis, left knee: Secondary | ICD-10-CM | POA: Diagnosis not present

## 2023-12-24 DIAGNOSIS — M542 Cervicalgia: Secondary | ICD-10-CM | POA: Diagnosis not present

## 2023-12-24 DIAGNOSIS — M79605 Pain in left leg: Secondary | ICD-10-CM | POA: Diagnosis not present

## 2023-12-31 DIAGNOSIS — G43909 Migraine, unspecified, not intractable, without status migrainosus: Secondary | ICD-10-CM | POA: Diagnosis not present

## 2023-12-31 DIAGNOSIS — M170 Bilateral primary osteoarthritis of knee: Secondary | ICD-10-CM | POA: Diagnosis not present

## 2024-01-06 ENCOUNTER — Other Ambulatory Visit: Payer: Self-pay

## 2024-01-06 ENCOUNTER — Observation Stay
Admission: RE | Admit: 2024-01-06 | Discharge: 2024-01-07 | Disposition: A | Discharge status: Home / Self Care | Attending: Internal Medicine | Admitting: Internal Medicine

## 2024-01-06 ENCOUNTER — Encounter: Payer: Self-pay | Admitting: Emergency Medicine

## 2024-01-06 ENCOUNTER — Emergency Department

## 2024-01-06 DIAGNOSIS — G43009 Migraine without aura, not intractable, without status migrainosus: Secondary | ICD-10-CM | POA: Diagnosis not present

## 2024-01-06 DIAGNOSIS — Z91041 Radiographic dye allergy status: Secondary | ICD-10-CM | POA: Insufficient documentation

## 2024-01-06 DIAGNOSIS — G459 Transient cerebral ischemic attack, unspecified: Principal | ICD-10-CM | POA: Diagnosis present

## 2024-01-06 DIAGNOSIS — M797 Fibromyalgia: Secondary | ICD-10-CM | POA: Diagnosis present

## 2024-01-06 DIAGNOSIS — I10 Essential (primary) hypertension: Secondary | ICD-10-CM | POA: Diagnosis not present

## 2024-01-06 DIAGNOSIS — R531 Weakness: Principal | ICD-10-CM

## 2024-01-06 DIAGNOSIS — Z87891 Personal history of nicotine dependence: Secondary | ICD-10-CM | POA: Diagnosis not present

## 2024-01-06 DIAGNOSIS — Z7982 Long term (current) use of aspirin: Secondary | ICD-10-CM | POA: Diagnosis not present

## 2024-01-06 DIAGNOSIS — R4781 Slurred speech: Secondary | ICD-10-CM | POA: Diagnosis not present

## 2024-01-06 DIAGNOSIS — R29818 Other symptoms and signs involving the nervous system: Secondary | ICD-10-CM | POA: Diagnosis not present

## 2024-01-06 DIAGNOSIS — R42 Dizziness and giddiness: Secondary | ICD-10-CM | POA: Diagnosis not present

## 2024-01-06 DIAGNOSIS — R29898 Other symptoms and signs involving the musculoskeletal system: Secondary | ICD-10-CM | POA: Diagnosis not present

## 2024-01-06 DIAGNOSIS — Z79890 Hormone replacement therapy (postmenopausal): Secondary | ICD-10-CM | POA: Insufficient documentation

## 2024-01-06 DIAGNOSIS — Z79899 Other long term (current) drug therapy: Secondary | ICD-10-CM | POA: Insufficient documentation

## 2024-01-06 DIAGNOSIS — E039 Hypothyroidism, unspecified: Secondary | ICD-10-CM | POA: Diagnosis not present

## 2024-01-06 DIAGNOSIS — R29810 Facial weakness: Secondary | ICD-10-CM | POA: Diagnosis not present

## 2024-01-06 DIAGNOSIS — I6522 Occlusion and stenosis of left carotid artery: Secondary | ICD-10-CM | POA: Diagnosis not present

## 2024-01-06 DIAGNOSIS — F419 Anxiety disorder, unspecified: Secondary | ICD-10-CM | POA: Diagnosis not present

## 2024-01-06 DIAGNOSIS — H538 Other visual disturbances: Secondary | ICD-10-CM | POA: Diagnosis not present

## 2024-01-06 DIAGNOSIS — G8191 Hemiplegia, unspecified affecting right dominant side: Secondary | ICD-10-CM | POA: Diagnosis present

## 2024-01-06 LAB — COMPREHENSIVE METABOLIC PANEL WITH GFR
ALT: 19 U/L (ref 0–44)
AST: 26 U/L (ref 15–41)
Albumin: 4.5 g/dL (ref 3.5–5.0)
Alkaline Phosphatase: 123 U/L (ref 38–126)
Anion gap: 13 (ref 5–15)
BUN: 18 mg/dL (ref 8–23)
CO2: 25 mmol/L (ref 22–32)
Calcium: 9.7 mg/dL (ref 8.9–10.3)
Chloride: 101 mmol/L (ref 98–111)
Creatinine, Ser: 0.77 mg/dL (ref 0.44–1.00)
GFR, Estimated: >60 mL/min (ref >60)
Glucose, Bld: 111 mg/dL — ABNORMAL HIGH (ref 70–99)
Potassium: 3.5 mmol/L (ref 3.5–5.1)
Sodium: 139 mmol/L (ref 135–145)
Total Bilirubin: 0.6 mg/dL (ref 0.0–1.2)
Total Protein: 7.9 g/dL (ref 6.5–8.1)

## 2024-01-06 LAB — CBC
HCT: 42.9 % (ref 36.0–46.0)
Hemoglobin: 13.7 g/dL (ref 12.0–15.0)
MCH: 29.3 pg (ref 26.0–34.0)
MCHC: 31.9 g/dL (ref 30.0–36.0)
MCV: 91.9 fL (ref 80.0–100.0)
Platelets: 272 K/uL (ref 150–400)
RBC: 4.67 MIL/uL (ref 3.87–5.11)
RDW: 13.6 % (ref 11.5–15.5)
WBC: 7.9 K/uL (ref 4.0–10.5)
nRBC: 0.3 % — ABNORMAL HIGH (ref 0.0–0.2)

## 2024-01-06 LAB — DIFFERENTIAL
Abs Immature Granulocytes: 0.03 K/uL (ref 0.00–0.07)
Basophils Absolute: 0 K/uL (ref 0.0–0.1)
Basophils Relative: 0 %
Eosinophils Absolute: 0.1 K/uL (ref 0.0–0.5)
Eosinophils Relative: 1 %
Immature Granulocytes: 0 %
Lymphocytes Relative: 40 %
Lymphs Abs: 3.1 K/uL (ref 0.7–4.0)
Monocytes Absolute: 0.4 K/uL (ref 0.1–1.0)
Monocytes Relative: 5 %
Neutro Abs: 4.2 K/uL (ref 1.7–7.7)
Neutrophils Relative %: 54 %

## 2024-01-06 LAB — PROTIME-INR
INR: 1 (ref 0.8–1.2)
Prothrombin Time: 13.4 s (ref 11.4–15.2)

## 2024-01-06 LAB — CBG MONITORING, ED: Glucose-Capillary: 97 mg/dL (ref 70–99)

## 2024-01-06 LAB — APTT: aPTT: 28 s (ref 24–36)

## 2024-01-06 LAB — ETHANOL: Alcohol, Ethyl (B): <15 mg/dL (ref <15)

## 2024-01-06 MED ORDER — TENECTEPLASE FOR STROKE: 0.2500 mg/kg | PACK | Freq: Once | INTRAVENOUS | Status: DC

## 2024-01-06 MED ORDER — TENECTEPLASE FOR STROKE
PACK | INTRAVENOUS | Status: DC
Start: 2024-01-06 — End: 2024-01-06
  Filled 2024-01-06: qty 10

## 2024-01-06 MED ORDER — SODIUM CHLORIDE 0.9% FLUSH: 3.0000 mL | Freq: Once | INTRAVENOUS | Status: DC

## 2024-01-06 NOTE — ED Provider Notes (Addendum)
 [facility] Provider Note    Event Date/Time   First MD Initiated Contact with Patient 01/06/24 2057     (approximate)   History   Code Stroke   HPI  Amy Rangel is a 68 year old female with history of migraines, fibromyalgia, TIA presenting to the emergency department for evaluation of sudden onset dizziness and right-sided weakness.  Patient reports that about 10 minutes prior to calling her neighbor who works for EMS, she had onset of dizziness with associated nausea and vomiting.  EMS reports that the call went out to their colleague at 8:30 PM.  They noted a right facial droop on arrival.  Patient notes an anaphylactic reaction to contrast, previously causing her to code.     Physical Exam   Triage Vital Signs: ED Triage Vitals  Encounter Vitals Group     BP 01/06/24 2100 (!) 149/61     Girls Systolic BP Percentile --      Girls Diastolic BP Percentile --      Boys Systolic BP Percentile --      Boys Diastolic BP Percentile --      Pulse Rate 01/06/24 2100 62     Resp 01/06/24 2100 19     Temp 01/06/24 2102 98.2 F (36.8 C)     Temp Source 01/06/24 2102 Oral     SpO2 01/06/24 2100 100 %     Weight 01/06/24 2123 153 lb 11.2 oz (69.7 kg)     Height 01/06/24 2123 5' 2 (1.575 m)     Head Circumference --      Peak Flow --      Pain Score 01/06/24 2106 0     Pain Loc --      Pain Education --      Exclude from Growth Chart --     Most recent vital signs: Vitals:   01/06/24 2101 01/06/24 2102  BP:    Pulse:    Resp:    Temp:  98.2 F (36.8 C)  SpO2: 94%      General: Awake, interactive  CV:  Regular rate, good peripheral perfusion.  Resp:  Unlabored respirations.  Abd:  Nondistended.  Neuro:  Keenly aware, correctly answers month and age, able to blink eyes and squeeze hands, normal horizontal extraocular movements, no visual field loss, normal facial symmetry, drift but does not hit bed of right arm and leg, no drift of left  side, no limb ataxia, sensory difference between right and left side, no aphasia, no dysarthria, no inattention.     ED Results / Procedures / Treatments   Labs (all labs ordered are listed, but only abnormal results are displayed) Labs Reviewed  CBC - Abnormal; Notable for the following components:      Result Value   nRBC 0.3 (*)    All other components within normal limits  COMPREHENSIVE METABOLIC PANEL WITH GFR - Abnormal; Notable for the following components:   Glucose, Bld 111 (*)    All other components within normal limits  PROTIME-INR  APTT  DIFFERENTIAL  ETHANOL  CBG MONITORING, ED     EKG EKG independently reviewed and interpreted by myself demonstrates:  EKG demonstrates sinus rhythm at a rate of 65, PR 136, QRS 94, QTc 445, no acute ST changes   RADIOLOGY Imaging independently reviewed and interpreted by myself demonstrates:  Noncontrast head CT without acute bleed  Formal Radiology Read:  CT HEAD CODE STROKE WO CONTRAST Result Date: 01/06/2024 CLINICAL DATA:  Code stroke.  Neuro deficit, acute, stroke suspected EXAM: CT HEAD WITHOUT CONTRAST TECHNIQUE: Contiguous axial images were obtained from the base of the skull through the vertex without intravenous contrast. RADIATION DOSE REDUCTION: This exam was performed according to the departmental dose-optimization program which includes automated exposure control, adjustment of the mA and/or kV according to patient size and/or use of iterative reconstruction technique. COMPARISON:  CT head March 12, 25. FINDINGS: Brain: No evidence of acute large vascular territory infarction, hemorrhage, hydrocephalus, extra-axial collection or mass lesion/mass effect. Vascular: No hyperdense vessel or unexpected calcification. Skull: Normal. Negative for fracture or focal lesion. Sinuses/Orbits: No acute finding. ASPECTS [facility] Stroke Program Early CT Score) Total score (0-10 with 10 being normal): 10. IMPRESSION: No evidence of acute  intracranial abnormality.  ASPECTS 10. Code stroke imaging results were communicated on 01/06/2024 at 9:07 pm to provider Hiawatha Merriott via telephone. Electronically Signed   By: Gilmore GORMAN Molt M.D.   On: 01/06/2024 21:07    PROCEDURES:  Critical Care performed: Yes, see critical care procedure note(s)  CRITICAL CARE Performed by: Nilsa Dade   Total critical care time: 31 minutes  Critical care time was exclusive of separately billable procedures and treating other patients.  Critical care was necessary to treat or prevent imminent or life-threatening deterioration.  Critical care was time spent personally by me on the following activities: development of treatment plan with patient and/or surrogate as well as nursing, discussions with consultants, evaluation of patient's response to treatment, examination of patient, obtaining history from patient or surrogate, ordering and performing treatments and interventions, ordering and review of laboratory studies, ordering and review of radiographic studies, pulse oximetry and re-evaluation of patient's condition.   Procedures   MEDICATIONS ORDERED IN ED: Medications  sodium chloride  flush (NS) 0.9 % injection 3 mL (0 mLs Intravenous Hold 01/06/24 2101)     IMPRESSION / MDM / ASSESSMENT AND PLAN / ED COURSE  I reviewed the triage vital signs and the nursing notes.  Differential diagnosis includes, but is not limited to, CVA, TIA, complex migraine, recrudescence of prior stroke  Patient's presentation is most consistent with acute presentation with potential threat to life or bodily function.  68 year old female presenting to the emergency department for evaluation with right-sided weakness as a code stroke.  Patient found to have right facial droop, mild right-sided weakness on presentation here.  She was evaluated by teleneurology who did initially recommend TNK given recent last known well and persistent symptoms.  However, as IV access was  established, patient did feel that her symptoms were improved and ultimately declined TNK.  Patient with anaphylaxis to contrast, in the setting of the CTA/perfusion study deferred.  Case was discussed with Dr. Donata with teleneurology.  She did recommend admission with MRI/MRA for further evaluation.  Will reach out to hospitalist team.  Clinical Course as of 01/06/24 2314  Mon Jan 06, 2024  2303 Case discussed with hospitalist team.  They will evaluate for anticipated admission. [NR]    Clinical Course User Index [NR] Dade Nilsa, MD     FINAL CLINICAL IMPRESSION(S) / ED DIAGNOSES   Final diagnoses:  Right sided weakness  Facial droop     Rx / DC Orders   ED Discharge Orders     None        Note:  This document was prepared using Dragon voice recognition software and may include unintentional dictation errors.   Dade Nilsa, MD 01/06/24 2219    Dade Nilsa, MD 01/06/24 [phone]

## 2024-01-06 NOTE — ED Notes (Signed)
 While obtaining IV access pt reversed decision to receive TNK. Says she thinks symptoms may be improving. Neurologist at bedside remotely to discuss with her.  Pt acknowledges risks and benefits. Continued monitoring.

## 2024-01-06 NOTE — ED Triage Notes (Signed)
 CODE STROKE  Pt arrived via [facility] as a Code Stroke. LKW 1915, symptoms onset at 1920 with right sided weakness, right facial droop and slurred speech.   LVO 4  18g Lt AC  Allergy to CONTRAST

## 2024-01-06 NOTE — Consult Note (Signed)
 [facility] Consult Services   Patient Name:   Amy Rangel, Amy Rangel Date of Birth:   1956/04/18 Identification Number:   MRN - [id number] Date of Service:   01/06/2024 20:37:39  Diagnosis:       G45.9 - Transient cerebral ischemic attack, unspecified  Impression:      68 year old female who presents with right side weakness and facial droop. Presentation may be due to atypical migraine vs TIA vs stroke. TNK was offered and initially patient was agreeable but as her symptoms improved she changed her mind.    ------------------------------------------------------------------------------  Advanced Imaging: Advanced Imaging Deferred because:  Non-disabling symptoms as verified by the patient; no cortical signs so not consistent with LVO   Metrics: Last Known Well: 01/06/2024 20:00:00 Dispatch Time: 01/06/2024 20:37:39 Arrival Time: 01/06/2024 20:41:00 Initial Response Time: 01/06/2024 20:46:47 Symptoms: Right side weakness. . Initial patient interaction: 01/06/2024 20:54:08 NIHSS Assessment Completed: 01/06/2024 21:02:45 Patient is not a candidate for Thrombolytic. Thrombolytic Medical Decision: 01/06/2024 21:39:00 Patient was not deemed candidate for Thrombolytic because of following reasons: Patient/Family declined . Weight Noted by Staff: 69.8 kg  CT Head: I personally reviewed all the CT images that were available to me and it showed: no acute hemorrhage or large territory infarct.  Primary Provider Notified of Diagnostic Impression and Management Plan on: 01/06/2024 21:46:26    ------------------------------------------------------------------------------  History of Present Illness: Patient is a 68 year old Female.  Patient was brought by EMS for symptoms of Right side weakness. . 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 patient was getting her food ready at 20:00 and then had sudden onset of dizziness and increased right facial droop, nausea, vomiting, and right leg weakness. Patient states she was in her normal state of health prior to 8pm. She reports the right leg weakness is new. Patient denies any recent episodes of weakness, strokes, prior ICH, GI bleeding, surgeries, and does not take any blood thinners.  Patient initially agreed to TNK but prior to receiving TNK she felt her symptoms were improving and changed her mind and declined TNK.   Past Medical History:      Hypertension      Migraine Headaches Other PMH:  TIA  Medications:  No Anticoagulant use  Antiplatelet use: Yes occasional Reviewed EMR for current medications  Allergies:  Reviewed  Social History: Drug Use: No  Family History:  There is no family history of premature cerebrovascular disease pertinent to this consultation  ROS : 14 Points Review of Systems was performed and was negative except mentioned in HPI.  Past Surgical History: There Is No Surgical History Contributory To Today's Visit    Examination: BP(149/75), Pulse(62), 1A: Level of Consciousness - Alert; keenly responsive + 0 1B: Ask Month and Age - Both Questions Right + 0 1C: Blink Eyes & Squeeze Hands - Performs Both Tasks + 0 2: Test Horizontal Extraocular Movements - Normal + 0 3: Test Visual Fields - No Visual Loss + 0 4: Test Facial Palsy (Use Grimace if Obtunded) - Partial paralysis (lower face) + 2 5A: Test Left Arm Motor Drift - No Drift for 10 Seconds + 0 5B: Test Right Arm Motor Drift - Drift, but doesn't hit bed + 1 6A: Test Left Leg Motor Drift - No Drift for 5 Seconds + 0 6B: Test  Right Leg Motor Drift - Drift, but doesn't hit bed + 1 7: Test Limb Ataxia (FNF/Heel-Shin) - No Ataxia + 0 8: Test Sensation - Mild-Moderate Loss: Less Sharp/More Dull + 1 9: Test  Language/Aphasia - Normal; No aphasia + 0 10: Test Dysarthria - Normal + 0 11: Test Extinction/Inattention - No abnormality + 0  NIHSS Free Text : Patient felt her symptoms were improving and on re-examination she no longer had a right arm drift.  Pre-Morbid Modified Rankin Scale: 1 Points = No significant disability despite symptoms; able to carry out all usual duties and activities  Spoke with : Dr. Levander  This consult was conducted in real time using interactive audio and Immunologist. Patient was informed of the technology being used for this visit and agreed to proceed. Patient located in hospital and provider located at home/office setting.   Patient is being evaluated for possible acute neurologic impairment and high probability of imminent or life-threatening deterioration. I spent total of 28 minutes providing care to this patient, including time for face to face visit via telemedicine, review of medical records, imaging studies and discussion of findings with providers, the patient and/or family.   Dr Kerri Gables   [facility] For Inpatient follow-up with [facility] physician please call [facility] at [phone]. As we are not an outpatient service for any post hospital discharge needs please contact the hospital for assistance. If you have any questions for the [facility] physicians or need to reconsult for clinical or diagnostic changes please contact us  via [facility] at [phone].

## 2024-01-06 NOTE — ED Notes (Signed)
 Questionable PIV placed. Ultrasound IV being placed for TNK.

## 2024-01-07 ENCOUNTER — Observation Stay
Admit: 2024-01-07 | Discharge: 2024-01-07 | Disposition: A | Discharge status: Home / Self Care | Attending: Internal Medicine

## 2024-01-07 ENCOUNTER — Emergency Department

## 2024-01-07 ENCOUNTER — Encounter: Payer: Self-pay | Admitting: Internal Medicine

## 2024-01-07 DIAGNOSIS — G459 Transient cerebral ischemic attack, unspecified: Principal | ICD-10-CM

## 2024-01-07 DIAGNOSIS — I6522 Occlusion and stenosis of left carotid artery: Secondary | ICD-10-CM | POA: Diagnosis not present

## 2024-01-07 DIAGNOSIS — G43109 Migraine with aura, not intractable, without status migrainosus: Secondary | ICD-10-CM

## 2024-01-07 DIAGNOSIS — I6389 Other cerebral infarction: Secondary | ICD-10-CM | POA: Diagnosis not present

## 2024-01-07 LAB — ECHOCARDIOGRAM COMPLETE
AR max vel: 2.93 cm2
AV Area VTI: 2.61 cm2
AV Area mean vel: 2.71 cm2
AV Mean grad: 3 mmHg
AV Peak grad: 5.8 mmHg
Ao pk vel: 1.2 m/s
Area-P 1/2: 2.95 cm2
Calc EF: 69 %
Height: 62 in
MV VTI: 2.34 cm2
S' Lateral: 2.3 cm
Single Plane A2C EF: 72.6 %
Single Plane A4C EF: 64.8 %
Weight: 2402.13 [oz_av]

## 2024-01-07 LAB — LIPID PANEL
Cholesterol: 221 mg/dL — ABNORMAL HIGH (ref 0–200)
HDL: 78 mg/dL (ref >40)
LDL Cholesterol: 128 mg/dL — ABNORMAL HIGH (ref 0–99)
Total CHOL/HDL Ratio: 2.8 ratio
Triglycerides: 74 mg/dL (ref <150)
VLDL: 15 mg/dL (ref 0–40)

## 2024-01-07 LAB — HEMOGLOBIN A1C
Hgb A1c MFr Bld: 5.3 % (ref 4.8–5.6)
Mean Plasma Glucose: 105.41 mg/dL

## 2024-01-07 LAB — HIV ANTIBODY (ROUTINE TESTING W REFLEX): HIV Screen 4th Generation wRfx: NONREACTIVE

## 2024-01-07 MED ORDER — THYROID 30 MG PO TABS: 30.0000 mg | ORAL_TABLET | Freq: Every day | ORAL | Status: DC

## 2024-01-07 MED ORDER — ONDANSETRON HCL 4 MG/2ML IJ SOLN
4.0000 mg | Freq: Four times a day (QID) | INTRAMUSCULAR | Status: DC | PRN
  Administered 2024-01-07: 4 mg via INTRAVENOUS
  Filled 2024-01-07: qty 2

## 2024-01-07 MED ORDER — ACETAMINOPHEN 650 MG RE SUPP: 650.0000 mg | RECTAL | Status: DC | PRN

## 2024-01-07 MED ORDER — HEART ATTACK BOUNCING BOOK: Freq: Once | Status: DC

## 2024-01-07 MED ORDER — THYROID 30 MG PO TABS
30.0000 mg | ORAL_TABLET | ORAL | Status: DC
  Administered 2024-01-07: 30 mg via ORAL
  Filled 2024-01-07: qty 1

## 2024-01-07 MED ORDER — CLONAZEPAM 0.5 MG PO TABS: 0.5000 mg | ORAL_TABLET | Freq: Every day | ORAL | Status: DC | PRN

## 2024-01-07 MED ORDER — ACETAMINOPHEN 325 MG PO TABS
650.0000 mg | ORAL_TABLET | ORAL | Status: DC | PRN
  Administered 2024-01-07: 650 mg via ORAL
  Filled 2024-01-07: qty 2

## 2024-01-07 MED ORDER — STROKE: EARLY STAGES OF RECOVERY BOOK: Freq: Once | Status: DC

## 2024-01-07 MED ORDER — CLOPIDOGREL BISULFATE 75 MG PO TABS: 75.0000 mg | ORAL_TABLET | Freq: Every day | ORAL | Status: DC

## 2024-01-07 MED ORDER — ENOXAPARIN SODIUM 40 MG/0.4ML IJ SOSY: 40.0000 mg | PREFILLED_SYRINGE | INTRAMUSCULAR | Status: DC

## 2024-01-07 MED ORDER — ACETAMINOPHEN 160 MG/5ML PO SOLN: 650.0000 mg | ORAL | Status: DC | PRN

## 2024-01-07 MED ORDER — TRAMADOL HCL 50 MG PO TABS: 50.0000 mg | ORAL_TABLET | Freq: Every day | ORAL | Status: DC | PRN

## 2024-01-07 MED ORDER — ASPIRIN 81 MG PO TBEC
81.0000 mg | DELAYED_RELEASE_TABLET | Freq: Every day | ORAL | Status: DC
  Administered 2024-01-07: 81 mg via ORAL
  Filled 2024-01-07: qty 1

## 2024-01-07 MED ORDER — THYROID 60 MG PO TABS: 60.0000 mg | ORAL_TABLET | ORAL | Status: DC

## 2024-01-07 MED ORDER — ROSUVASTATIN CALCIUM 20 MG PO TABS: 10.0000 mg | ORAL_TABLET | Freq: Every day | ORAL | 11 refills | Status: AC

## 2024-01-07 NOTE — Assessment & Plan Note (Signed)
 Left ICA occlusion History of tPA for TIA/CVA History of hemiplegic migraine Permissive hypertension for first 24-48 hrs post stroke onset: Prn Labetalol IV or Vasotec IV If BP greater than 220/120  Statins for LDL goal less than 70 ASA 81mg  daily, Plavix  75mg  daily x 3 weeks then monotherapy thereafter Telemetry, echo, MRI Avoid dextrose containing fluids, Maintain euglycemia, euthermia Neuro checks q4 hrs x 24 hrs and then per shift Head of bed 30 degrees Physical therapy/Occupational therapy/Speech therapy if failed dysphagia screen Neurology consult to follow

## 2024-01-07 NOTE — ED Notes (Signed)
 Patient transported to MRI

## 2024-01-07 NOTE — Assessment & Plan Note (Addendum)
 Continue Fioricet as needed Flexeril  and continue magnesium

## 2024-01-07 NOTE — Assessment & Plan Note (Signed)
 Continue Klonopin as needed.

## 2024-01-07 NOTE — Progress Notes (Signed)
 SLP Cancellation Note  Patient Details Name: Amy Rangel MRN: [id number] DOB: [birth year]   Cancelled treatment:       Reason Eval/Treat Not Completed: SLP screened, no needs identified, will sign off (chart reviewed; consulted NSG and , met w/ pt.)   Pt admitted to the ED w/ reported right side weakness and slight facial droop. Per MD note, presentation may be due to atypical migraine vs TIA vs stroke. Also, TNK was offered and initially patient was agreeable but as her symptoms improved she changed her mind. This morning, pt denied any facial droop and stated her s/s were back to normal she felt(sitting in bed).  Pt denied any difficulty swallowing and is currently on a regular diet; tolerates swallowing pills w/ water per NSG. She sipped water during visit. Pt conversed in full conversation w/out expressive/receptive deficits noted; pt denied any speech-language deficits. Speech clear . No further skilled ST services indicated as pt appears at her baseline. Pt agreed. NSG to reconsult if any change in status while admitted.     Comer Portugal, MS, CCC-SLP Speech Language Pathologist Rehab Services; [facility] [phone] (ascom) Arcelia Pals 01/07/2024, 8:50 AM

## 2024-01-07 NOTE — Assessment & Plan Note (Signed)
-  Continue Armour Thyroid 

## 2024-01-07 NOTE — Progress Notes (Signed)
 OT Cancellation Note  Patient Details Name: Amy Rangel MRN: [id number] DOB: [birth year]   Cancelled Treatment:    Reason Eval/Treat Not Completed: OT screened, no needs identified, will sign off. Chart reviewed. Pt received sitting on the EOB post PT evaluation. Pt endorses no difficulties completing ADL/IADLs, and all symptoms have resolved. OT will sign off, if acute OT needs surface please re consult.   Larraine Colas M.S. OTR/L  01/07/24, 1:05 PM

## 2024-01-07 NOTE — ED Notes (Signed)
 CCMD called for cardiac monitoring.

## 2024-01-07 NOTE — Assessment & Plan Note (Signed)
 Continue tramadol  and Flexeril  as needed

## 2024-01-07 NOTE — TOC Transition Note (Signed)
 Transition of Care [facility]) - Discharge Note   Patient Details  Name: Amy Rangel MRN: [id number] Date of Birth: 05/25/1956  [facility] CM/SW Contact:  Elouise LULLA Capri, RN 01/07/2024, 12:02 PM   Clinical Narrative:     Discharge orders noted. CM call to patient's room regarding pending discharge. Patient to discharge via friend, [name]. Per patient has follow up appointment with PCP, Dr. Epifanio on 01/16/2024 at 0900. No identified needs.   Final next level of care: Home/Self Care Barriers to Discharge: No Barriers Identified   Patient Goals and CMS Choice    Home/self care   Discharge Placement    Home/self care           Discharge Plan and Services Additional resources added to the After Visit Summary for       Social Drivers of Health (SDOH) Interventions SDOH Screenings   Food Insecurity: No Food Insecurity (01/07/2024)  Housing: Low Risk  (01/07/2024)  Transportation Needs: No Transportation Needs (01/07/2024)  Utilities: Not At Risk (01/07/2024)  Depression (PHQ2-9): Low Risk  (02/24/2021)  Financial Resource Strain: Low Risk  (07/18/2023)   Received from [facility]  Social Connections: Moderately Integrated (01/07/2024)  Tobacco Use: Medium Risk (01/07/2024)     Readmission Risk Interventions     No data to display

## 2024-01-07 NOTE — H&P (Signed)
 History and Physical    Patient: Amy Rangel FMW:[id number] DOB: 09-05-1955 DOA: 01/06/2024 DOS: the patient was seen and examined on 01/07/2024 PCP: Epifanio Alm SQUIBB, MD  Patient coming from: Home  Chief Complaint:  Chief Complaint  Patient presents with   Code Stroke    HPI: Amy Rangel is a 69 y.o. female with medical history significant for  migraines, left ICA occlusion, hypothyroidism, fibromyalgia, anxiety, TIA/CVA around 2018 treated with tPA, TIA 2024 treated as complex migraine, being admitted for stroke workup after presenting as a code stroke.  She presented within with right-sided weakness and facial droop, onset 2000, arriving within the tPA window.  CT head per code stroke protocol negative.  She was evaluated by teleneurology who opined atypical migraine versus TIA versus stroke.   TNK was offered.  According to neurology note, she was initially agreeable but as symptoms improved she subsequently changed her mind. Patient did not get CTA head and neck due to contrast allergy. Additional ED workup: Vitals were within normal limits on arrival and during workup. Labs including CBC, CMP and EtOH were all unremarkable EKG showed sinus rhythm at 65 with no concerning abnormalities MRI brain and MRA  head and neck ordered. Admission requested.     Review of Systems: As mentioned in the history of present illness. All other systems reviewed and are negative.  Past Medical History:  Diagnosis Date   Cancer (HCC)    skin ca   Complication of anesthesia    Slow to wake, gets the shakes   Fibromyalgia    Hypothyroidism    ICAO (internal carotid artery occlusion)    left   Migraine headache    approx 4-5x/yr   Motion sickness    boats   TIA (transient ischemic attack)    No deficits   Past Surgical History:  Procedure Laterality Date   ABDOMINAL HYSTERECTOMY     BREAST BIOPSY Left 1995   neg   BREAST SURGERY Left 1993   Left lumpectomy/partial mastectomy    CATARACT EXTRACTION W/PHACO Left 03/07/2021   Procedure: CATARACT EXTRACTION PHACO AND INTRAOCULAR LENS PLACEMENT (IOC) LEFT;  Surgeon: Jaye Fallow, MD;  Location: [facility];  Service: Ophthalmology;  Laterality: Left;  11.77 1:23.9   CATARACT EXTRACTION W/PHACO Right 03/21/2021   Procedure: CATARACT EXTRACTION PHACO AND INTRAOCULAR LENS PLACEMENT (IOC) RIGHT 6.40 00:42.1;  Surgeon: Jaye Fallow, MD;  Location: [facility];  Service: Ophthalmology;  Laterality: Right;   COLONOSCOPY WITH PROPOFOL  N/A 02/27/2022   Procedure: COLONOSCOPY WITH PROPOFOL ;  Surgeon: Maryruth Ole DASEN, MD;  Location: [facility];  Service: Endoscopy;  Laterality: N/A;   EYE SURGERY     foot surgery Right    Right foot ganglion cyst removal   TONSILLECTOMY     Social History:  reports that she quit smoking about 5 years ago. Her smoking use included cigarettes. She started smoking about 45 years ago. She has a 30 pack-year smoking history. She has never used smokeless tobacco. She reports that she does not currently use alcohol. She reports that she does not currently use drugs.  Allergies  Allergen Reactions   Bupropion Swelling    Lip swelling    Citrullus Vulgaris Other (See Comments), Rash and Swelling   Gabapentin Other (See Comments) and Shortness Of Breath    Acid reflux and chest pressure    Iodinated Contrast Media Anaphylaxis    Uncoded Allergy. Allergen: ct contrast dye (with steroids), Other Reaction: THROAT TIGHTNING  Iodine Other (See Comments)    Other reaction(s): Other (See Comments) Other Reaction: ct contrast & xray dye Other Reaction: ct contrast & xray dye    Iopamidol Other (See Comments)    Other reaction(s): Other (See Comments) Other Reaction: other reaction Other Reaction: other reaction    Levetiracetam Other (See Comments) and Swelling    Facial swelling, upset stomach Swelling of face and felt drunk    Mangifera Indica Rash and Swelling    Neomycin-Polymyxin-Dexameth Rash and Swelling   Papaya Rash and Swelling   Prednisone Anaphylaxis   Sulfa Antibiotics Anaphylaxis and Other (See Comments)    Uncoded Allergy. Allergen: xray dye Uncoded Allergy. Allergen: ct contrast dye (with steroids), Other Reaction: THROAT TIGHTNING    Zolpidem Diarrhea   Teriparatide Other (See Comments)    Patient states that when she took this medication she had a sore throat and was very fatigue. Patient states that when she took this medication she had a sore throat and was very fatigue.    Aluminum Other (See Comments)    Growths Growths    Amoxicillin     Other reaction(s): Unknown Other reaction(s): Unknown    Atorvastatin Diarrhea   Codeine Other (See Comments)    Other reaction(s): Other (See Comments) Other Reaction: NOT ASSESSED Other Reaction: NOT ASSESSED    Diclofenac Potassium Diarrhea   Duloxetine Diarrhea   Duloxetine Hcl     Other reaction(s): Other (See Comments) constipation   Erythromycin Swelling    Swelling of face    Escitalopram Other (See Comments)    Insomnia Insomnia    Eslicarbazepine Diarrhea and Other (See Comments)    Diarrhea, insomnia   Hepatitis B Vaccine Other (See Comments)    Chest pain and arm pain    Ibuprofen Other (See Comments)    Stomach upset real bad Stomach upset real bad    Ketoprofen Other (See Comments)    Stomach upset really bad    Lanolin Other (See Comments)    Other reaction(s): Other (See Comments) Other Reaction: NOT ASSESSED Other Reaction: NOT ASSESSED    Metoclopramide     error   Midodrine Hcl Other (See Comments)    headache   Monosodium Glutamate     Other reaction(s): Unknown Other reaction(s): Unknown    Morphine Nausea And Vomiting    vomiting   Moxifloxacin  Other (See Comments)    Other reaction(s): Other (See Comments) Other Reaction: OTHER REACTION Other Reaction: OTHER REACTION    Moxifloxacin  Hcl    Naproxen    Naproxen Sodium  Other (See Comments)    Bloody stools Bloody stool    Other     Client reports 35 allergies. Copy of allergies scanned. Vertis RN, [facility]   Oxycodone    Oxycodone-Acetaminophen  Other (See Comments)    Other reaction(s): Other (See Comments) Other Reaction: NOT ASSESSED Other Reaction: NOT ASSESSED    Sulfamethoxazole-Trimethoprim     Trimethoprim     Zonisamide Other (See Comments)    Bladder infection     Lamotrigine Rash   Neomycin-Polymyxin B  Gu Rash   Sertraline Rash   Sertraline Hcl Rash   Valproic Acid Nausea Only    Family History  Problem Relation Age of Onset   Hypothyroidism Mother    Breast cancer Mother 58   Leukemia Father     Prior to Admission medications   Medication Sig Start Date End Date Taking? Authorizing Provider  ARMOUR THYROID  30 MG tablet Take 30 mg by mouth  daily. Take 1 tablet every day except Thursday (2 tablets 60mg ) 02/10/21   [provider]  aspirin  81 MG chewable tablet Chew 1 tablet (81 mg total) by mouth daily. 12/12/22   Alexander, Natalie, DO  Bioflavonoid Products (QUERCETIN COMPLEX IMMUNE PO) Take 1 tablet by mouth 3 (three) times a week.    [provider]  butalbital-acetaminophen -caffeine (FIORICET) 50-325-40 MG tablet Take 1 tablet by mouth every 6 (six) hours as needed for migraine. 04/08/19   [provider]  Cholecalciferol 50 MCG (2000 UT) TABS Take 2,000 Units by mouth daily.    [provider]  clonazePAM  (KLONOPIN ) 0.5 MG tablet Take 0.5 tablets (0.25 mg total) by mouth daily. Patient taking differently: Take 0.25 mg by mouth daily as needed. 04/21/20   Malfi, Nat HERO, FNP  cyclobenzaprine  (FLEXERIL ) 5 MG tablet Take 5 mg by mouth 3 (three) times daily. 01/19/20   [provider]  GNP GARLIC EXTRACT PO Take 1 tablet by mouth daily.    [provider]  Magnesium Gluconate 550 MG TABS Take 1 tablet by mouth daily.    [provider]  Omega 3  1000 MG CAPS Take 1 capsule by mouth daily.    [provider]  Riboflavin (B-2-400 PO) Take by mouth.    [provider]  traMADol  (ULTRAM ) 50 MG tablet Take 50 mg by mouth daily as needed.  10/12/19   [provider]  triamcinolone ointment (KENALOG) 0.1 % Apply 1 Application topically 2 (two) times daily as needed (posion ivy). 05/21/22   [provider]  Turmeric Curcumin 500 MG CAPS Take 1 capsule by mouth daily as needed.    [provider]  Zinc Sulfate 66 MG TABS Take 1 tablet by mouth 3 (three) times a week.    [provider]    Physical Exam: Vitals:   01/06/24 2123 01/06/24 2300 01/07/24 0030 01/07/24 0045  BP:  130/60 134/66   Pulse:  68 72   Resp:  13 17   Temp:    97.9 F (36.6 C)  TempSrc:    Oral  SpO2:  98% 98%   Weight: 69.7 kg     Height: 5' 2 (1.575 m)      Physical Exam Vitals and nursing note reviewed.  Constitutional:      General: She is not in acute distress. HENT:     Head: Normocephalic and atraumatic.  Cardiovascular:     Rate and Rhythm: Normal rate and regular rhythm.     Heart sounds: Normal heart sounds.  Pulmonary:     Effort: Pulmonary effort is normal.     Breath sounds: Normal breath sounds.  Abdominal:     Palpations: Abdomen is soft.     Tenderness: There is no abdominal tenderness.  Neurological:     Mental Status: Mental status is at baseline.     Labs on Admission: I have personally reviewed following labs and imaging studies  CBC: Recent Labs  Lab 01/06/24 2133  WBC 7.9  NEUTROABS 4.2  HGB 13.7  HCT 42.9  MCV 91.9  PLT 272   Basic Metabolic Panel: Recent Labs  Lab 01/06/24 2133  NA 139  K 3.5  CL 101  CO2 25  GLUCOSE 111*  BUN 18  CREATININE 0.77  CALCIUM  9.7   GFR: Estimated Creatinine Clearance: 59.8 mL/min (by C-G formula based on SCr of 0.77 mg/dL). Liver Function Tests: Recent Labs  Lab 01/06/24 2133  AST 26  ALT  19  ALKPHOS 123  BILITOT 0.6   PROT 7.9  ALBUMIN 4.5   No results for input(s): LIPASE, AMYLASE in the last 168 hours. No results for input(s): AMMONIA in the last 168 hours. Coagulation Profile: Recent Labs  Lab 01/06/24 2133  INR 1.0   Cardiac Enzymes: No results for input(s): CKTOTAL, CKMB, CKMBINDEX, TROPONINI in the last 168 hours. BNP (last 3 results) No results for input(s): PROBNP in the last 8760 hours. HbA1C: No results for input(s): HGBA1C in the last 72 hours. CBG: Recent Labs  Lab 01/06/24 2140  GLUCAP 97   Lipid Profile: No results for input(s): CHOL, HDL, LDLCALC, TRIG, CHOLHDL, LDLDIRECT in the last 72 hours. Thyroid  Function Tests: No results for input(s): TSH, T4TOTAL, FREET4, T3FREE, THYROIDAB in the last 72 hours. Anemia Panel: No results for input(s): VITAMINB12, FOLATE, FERRITIN, TIBC, IRON, RETICCTPCT in the last 72 hours. Urine analysis:    Component Value Date/Time   COLORURINE COLORLESS (A) 12/10/2022 0814   APPEARANCEUR CLEAR (A) 12/10/2022 0814   LABSPEC 1.003 (L) 12/10/2022 0814   PHURINE 8.0 12/10/2022 0814   GLUCOSEU NEGATIVE 12/10/2022 0814   HGBUR NEGATIVE 12/10/2022 0814   BILIRUBINUR NEGATIVE 12/10/2022 0814   BILIRUBINUR negative 01/29/2020 1546   KETONESUR NEGATIVE 12/10/2022 0814   PROTEINUR NEGATIVE 12/10/2022 0814   UROBILINOGEN 0.2 01/29/2020 1546   NITRITE NEGATIVE 12/10/2022 0814   LEUKOCYTESUR NEGATIVE 12/10/2022 0814    Radiological Exams on Admission: CT HEAD CODE STROKE WO CONTRAST Result Date: 01/06/2024 CLINICAL DATA:  Code stroke.  Neuro deficit, acute, stroke suspected EXAM: CT HEAD WITHOUT CONTRAST TECHNIQUE: Contiguous axial images were obtained from the base of the skull through the vertex without intravenous contrast. RADIATION DOSE REDUCTION: This exam was performed according to the departmental dose-optimization program which includes automated exposure control, adjustment of the mA and/or  kV according to patient size and/or use of iterative reconstruction technique. COMPARISON:  CT head March 12, 25. FINDINGS: Brain: No evidence of acute large vascular territory infarction, hemorrhage, hydrocephalus, extra-axial collection or mass lesion/mass effect. Vascular: No hyperdense vessel or unexpected calcification. Skull: Normal. Negative for fracture or focal lesion. Sinuses/Orbits: No acute finding. ASPECTS [facility] Stroke Program Early CT Score) Total score (0-10 with 10 being normal): 10. IMPRESSION: No evidence of acute intracranial abnormality.  ASPECTS 10. Code stroke imaging results were communicated on 01/06/2024 at 9:07 pm to provider Ray via telephone. Electronically Signed   By: Gilmore GORMAN Molt M.D.   On: 01/06/2024 21:07   Data Reviewed for HPI: Relevant notes from primary care and specialist visits, past discharge summaries as available in EHR, including Care Everywhere. Prior diagnostic testing as pertinent to current admission diagnoses Updated medications and problem lists for reconciliation ED course, including vitals, labs, imaging, treatment and response to treatment Triage notes, nursing and pharmacy notes and ED provider's notes Notable results as noted above in HPI      Assessment and Plan: * TIA, recurrent (transient ischemic attack) Left ICA occlusion History of tPA for TIA/CVA History of hemiplegic migraine Permissive hypertension for first 24-48 hrs post stroke onset: Prn Labetalol IV or Vasotec IV If BP greater than 220/120  Statins for LDL goal less than 70 ASA 81mg  daily, Plavix  75mg  daily x 3 weeks then monotherapy thereafter Telemetry, echo, MRI Avoid dextrose containing fluids, Maintain euglycemia, euthermia Neuro checks q4 hrs x 24 hrs and then per shift Head of bed 30 degrees Physical therapy/Occupational therapy/Speech therapy if failed dysphagia screen Neurology consult to follow  Hypothyroidism Continue Armour Thyroid   Migraine  without aura and without status migrainosus, not intractable Continue Fioricet as needed Flexeril  and continue magnesium  Fibromyalgia Continue tramadol  and Flexeril  as needed  Anxiety Continue Klonopin  as needed     DVT prophylaxis: Lovenox   Consults: neurology, Dr Voncile  Advance Care Planning:   Code Status: Prior   Family Communication: none  Disposition Plan: Back to previous home environment  Severity of Illness: The appropriate patient status for this patient is OBSERVATION. Observation status is judged to be reasonable and necessary in order to provide the required intensity of service to ensure the patient's safety. The patient's presenting symptoms, physical exam findings, and initial radiographic and laboratory data in the context of their medical condition is felt to place them at decreased risk for further clinical deterioration. Furthermore, it is anticipated that the patient will be medically stable for discharge from the hospital within 2 midnights of admission.   Author: Delayne LULLA Solian, MD 01/07/2024 1:11 AM  For on call review [email or web address].

## 2024-01-07 NOTE — ED Notes (Addendum)
 ED TO INPATIENT HANDOFF REPORT  ED Nurse Name and Phone #: Venetia CROME, RN   S Name/Age/Gender Amy Rangel 68 y.o. female Room/Bed: ED37A/ED37A  Code Status   Code Status: Full Code  Home/SNF/Other Home Patient oriented to: self, place, time, and situation Is this baseline? Yes   Triage Complete: Triage complete  Chief Complaint TIA (transient ischemic attack) [G45.9]  Triage Note CODE STROKE  Pt arrived via [facility] as a Code Stroke. LKW 1915, symptoms onset at 1920 with right sided weakness, right facial droop and slurred speech.   LVO 4  18g Lt AC  Allergy to CONTRAST    Allergies Allergies  Allergen Reactions   Bupropion Swelling    Lip swelling    Citrullus Vulgaris Other (See Comments), Rash and Swelling   Gabapentin Other (See Comments) and Shortness Of Breath    Acid reflux and chest pressure    Iodinated Contrast Media Anaphylaxis    Uncoded Allergy. Allergen: ct contrast dye (with steroids), Other Reaction: THROAT TIGHTNING   Iodine Other (See Comments)    Other reaction(s): Other (See Comments) Other Reaction: ct contrast & xray dye Other Reaction: ct contrast & xray dye    Iopamidol Other (See Comments)    Other reaction(s): Other (See Comments) Other Reaction: other reaction Other Reaction: other reaction    Levetiracetam Other (See Comments) and Swelling    Facial swelling, upset stomach Swelling of face and felt drunk    Mangifera Indica Rash and Swelling   Neomycin-Polymyxin-Dexameth Rash and Swelling   Papaya Rash and Swelling   Prednisone Anaphylaxis   Sulfa Antibiotics Anaphylaxis and Other (See Comments)    Uncoded Allergy. Allergen: xray dye Uncoded Allergy. Allergen: ct contrast dye (with steroids), Other Reaction: THROAT TIGHTNING    Zolpidem Diarrhea   Teriparatide Other (See Comments)    Patient states that when she took this medication she had a sore throat and was very fatigue. Patient states that when she took this  medication she had a sore throat and was very fatigue.    Aluminum Other (See Comments)    Growths Growths    Amoxicillin     Other reaction(s): Unknown Other reaction(s): Unknown    Atorvastatin Diarrhea   Codeine Other (See Comments)    Other reaction(s): Other (See Comments) Other Reaction: NOT ASSESSED Other Reaction: NOT ASSESSED    Diclofenac Potassium Diarrhea   Duloxetine Diarrhea   Duloxetine Hcl     Other reaction(s): Other (See Comments) constipation   Erythromycin Swelling    Swelling of face    Escitalopram Other (See Comments)    Insomnia Insomnia    Eslicarbazepine Diarrhea and Other (See Comments)    Diarrhea, insomnia   Hepatitis B Vaccine Other (See Comments)    Chest pain and arm pain    Ibuprofen Other (See Comments)    Stomach upset real bad Stomach upset real bad    Ketoprofen Other (See Comments)    Stomach upset really bad    Lanolin Other (See Comments)    Other reaction(s): Other (See Comments) Other Reaction: NOT ASSESSED Other Reaction: NOT ASSESSED    Metoclopramide     error   Midodrine Hcl Other (See Comments)    headache   Monosodium Glutamate     Other reaction(s): Unknown Other reaction(s): Unknown    Morphine Nausea And Vomiting    vomiting   Moxifloxacin  Other (See Comments)    Other reaction(s): Other (See Comments) Other Reaction: OTHER REACTION Other Reaction: OTHER  REACTION    Moxifloxacin  Hcl    Naproxen    Naproxen Sodium Other (See Comments)    Bloody stools Bloody stool    Other     Client reports 35 allergies. Copy of allergies scanned. Vertis RN, [facility]   Oxycodone    Oxycodone-Acetaminophen  Other (See Comments)    Other reaction(s): Other (See Comments) Other Reaction: NOT ASSESSED Other Reaction: NOT ASSESSED    Sulfamethoxazole-Trimethoprim     Trimethoprim     Zonisamide Other (See Comments)    Bladder infection     Lamotrigine Rash    Neomycin-Polymyxin B  Gu Rash   Sertraline Rash   Sertraline Hcl Rash   Valproic Acid Nausea Only    Level of Care/Admitting Diagnosis ED Disposition     ED Disposition  Admit   Condition  --   Comment  Hospital Area: [facility] [[id number]]  Level of Care: Telemetry Medical [104]  Covid Evaluation: Asymptomatic - no recent exposure (last 10 days) testing not required  Diagnosis: TIA (transient ischemic attack) [832385]  Admitting Physician: CLEATUS DELAYNE GAILS [[id number]]  Attending Physician: CLEATUS DELAYNE GAILS [[id number]]          B Medical/Surgery History Past Medical History:  Diagnosis Date   Cancer (HCC)    skin ca   Complication of anesthesia    Slow to wake, gets the shakes   Fibromyalgia    Hypothyroidism    ICAO (internal carotid artery occlusion)    left   Migraine headache    approx 4-5x/yr   Motion sickness    boats   TIA (transient ischemic attack)    No deficits   Past Surgical History:  Procedure Laterality Date   ABDOMINAL HYSTERECTOMY     BREAST BIOPSY Left 1995   neg   BREAST SURGERY Left 1993   Left lumpectomy/partial mastectomy   CATARACT EXTRACTION W/PHACO Left 03/07/2021   Procedure: CATARACT EXTRACTION PHACO AND INTRAOCULAR LENS PLACEMENT (IOC) LEFT;  Surgeon: Jaye Fallow, MD;  Location: [facility];  Service: Ophthalmology;  Laterality: Left;  11.77 1:23.9   CATARACT EXTRACTION W/PHACO Right 03/21/2021   Procedure: CATARACT EXTRACTION PHACO AND INTRAOCULAR LENS PLACEMENT (IOC) RIGHT 6.40 00:42.1;  Surgeon: Jaye Fallow, MD;  Location: [facility];  Service: Ophthalmology;  Laterality: Right;   COLONOSCOPY WITH PROPOFOL  N/A 02/27/2022   Procedure: COLONOSCOPY WITH PROPOFOL ;  Surgeon: Maryruth Ole DASEN, MD;  Location: [facility];  Service: Endoscopy;  Laterality: N/A;   EYE SURGERY     foot surgery Right    Right foot ganglion cyst removal   TONSILLECTOMY       A IV  Location/Drains/Wounds Patient Lines/Drains/Airways Status     Active Line/Drains/Airways     Name Placement date Placement time Site Days   Peripheral IV 01/06/24 18 G 1.16 Left Antecubital 01/06/24  2101  Antecubital  1   Peripheral IV 01/06/24 18 G 1.16 Anterior;Left;Upper Arm 01/06/24  2132  Arm  1            Intake/Output Last 24 hours No intake or output data in the 24 hours ending 01/07/24 0143  Labs/Imaging Results for orders placed or performed during the hospital encounter of 01/06/24 (from the past 48 hours)  Protime-INR     Status: None   Collection Time: 01/06/24  9:33 PM  Result Value Ref Range   Prothrombin Time 13.4 11.4 - 15.2 seconds   INR 1.0 0.8 - 1.2    Comment: (NOTE) INR goal varies  based on device and disease states. Performed at [facility], [address] [place], [place] [address]   APTT     Status: None   Collection Time: 01/06/24  9:33 PM  Result Value Ref Range   aPTT 28 24 - 36 seconds    Comment: Performed at [facility], [address] [place], [place] [address]  CBC     Status: Abnormal   Collection Time: 01/06/24  9:33 PM  Result Value Ref Range   WBC 7.9 4.0 - 10.5 K/uL   RBC 4.67 3.87 - 5.11 MIL/uL   Hemoglobin 13.7 12.0 - 15.0 g/dL   HCT 57.0 63.9 - 53.9 %   MCV 91.9 80.0 - 100.0 fL   MCH 29.3 26.0 - 34.0 pg   MCHC 31.9 30.0 - 36.0 g/dL   RDW 86.3 88.4 - 84.4 %   Platelets 272 150 - 400 K/uL   nRBC 0.3 (H) 0.0 - 0.2 %    Comment: Performed at [facility], [address] [place], [place] [address]  Differential     Status: None   Collection Time: 01/06/24  9:33 PM  Result Value Ref Range   Neutrophils Relative % 54 %   Neutro Abs 4.2 1.7 - 7.7 K/uL   Lymphocytes Relative 40 %   Lymphs Abs 3.1 0.7 - 4.0 K/uL   Monocytes Relative 5 %   Monocytes Absolute 0.4 0.1 - 1.0 K/uL   Eosinophils Relative 1 %   Eosinophils Absolute 0.1 0.0 - 0.5 K/uL   Basophils Relative 0 %   Basophils  Absolute 0.0 0.0 - 0.1 K/uL   Immature Granulocytes 0 %   Abs Immature Granulocytes 0.03 0.00 - 0.07 K/uL    Comment: Performed at [facility], [address] [place], [place] [address]  Comprehensive metabolic panel     Status: Abnormal   Collection Time: 01/06/24  9:33 PM  Result Value Ref Range   Sodium 139 135 - 145 mmol/L   Potassium 3.5 3.5 - 5.1 mmol/L   Chloride 101 98 - 111 mmol/L   CO2 25 22 - 32 mmol/L   Glucose, Bld 111 (H) 70 - 99 mg/dL    Comment: Glucose reference range applies only to samples taken after fasting for at least 8 hours.   BUN 18 8 - 23 mg/dL   Creatinine, Ser 9.22 0.44 - 1.00 mg/dL   Calcium  9.7 8.9 - 10.3 mg/dL   Total Protein 7.9 6.5 - 8.1 g/dL   Albumin 4.5 3.5 - 5.0 g/dL   AST 26 15 - 41 U/L   ALT 19 0 - 44 U/L   Alkaline Phosphatase 123 38 - 126 U/L   Total Bilirubin 0.6 0.0 - 1.2 mg/dL   GFR, Estimated >39 >39 mL/min    Comment: (NOTE) Calculated using the CKD-EPI Creatinine Equation (2021)    Anion gap 13 5 - 15    Comment: Performed at [facility], [address] [place], [place] [address]  Ethanol     Status: None   Collection Time: 01/06/24  9:33 PM  Result Value Ref Range   Alcohol, Ethyl (B) <15 <15 mg/dL    Comment: (NOTE) For medical purposes only. Performed at [facility], [address] [place], [place] [address]   CBG monitoring, ED     Status: None   Collection Time: 01/06/24  9:40 PM  Result Value Ref Range   Glucose-Capillary 97 70 - 99 mg/dL  Comment: Glucose reference range applies only to samples taken after fasting for at least 8 hours.   CT HEAD CODE STROKE WO CONTRAST Result Date: 01/06/2024 CLINICAL DATA:  Code stroke.  Neuro deficit, acute, stroke suspected EXAM: CT HEAD WITHOUT CONTRAST TECHNIQUE: Contiguous axial images were obtained from the base of the skull through the vertex without intravenous contrast. RADIATION DOSE REDUCTION: This exam was performed according to the  departmental dose-optimization program which includes automated exposure control, adjustment of the mA and/or kV according to patient size and/or use of iterative reconstruction technique. COMPARISON:  CT head March 12, 25. FINDINGS: Brain: No evidence of acute large vascular territory infarction, hemorrhage, hydrocephalus, extra-axial collection or mass lesion/mass effect. Vascular: No hyperdense vessel or unexpected calcification. Skull: Normal. Negative for fracture or focal lesion. Sinuses/Orbits: No acute finding. ASPECTS [facility] Stroke Program Early CT Score) Total score (0-10 with 10 being normal): 10. IMPRESSION: No evidence of acute intracranial abnormality.  ASPECTS 10. Code stroke imaging results were communicated on 01/06/2024 at 9:07 pm to provider Ray via telephone. Electronically Signed   By: Gilmore GORMAN Molt M.D.   On: 01/06/2024 21:07    Pending Labs Unresulted Labs (From admission, onward)     Start     Ordered   01/14/24 0500  Creatinine, serum  (enoxaparin  (LOVENOX )    CrCl >/= 30 ml/min)  Weekly,   TIMED     Comments: while on enoxaparin  therapy    01/07/24 0117   01/07/24 0500  Lipid panel  (Labs)  Tomorrow morning,   R       Comments: Fasting    01/07/24 0117   01/07/24 0139  Hemoglobin A1c  (Labs)  Add-on,   AD       Comments: To assess prior glycemic control    01/07/24 0138   01/07/24 0116  HIV Antibody (routine testing w rflx)  (HIV Antibody (Routine testing w reflex) panel)  Once,   R        01/07/24 0117            Vitals/Pain Today's Vitals   01/06/24 2300 01/07/24 0000 01/07/24 0030 01/07/24 0045  BP: 130/60 122/62 134/66   Pulse: 68 70 72   Resp: 13 13 17    Temp:    97.9 F (36.6 C)  TempSrc:    Oral  SpO2: 98%  98%   Weight:      Height:      PainSc:        Isolation Precautions No active isolations  Medications Medications  sodium chloride  flush (NS) 0.9 % injection 3 mL (0 mLs Intravenous Hold 01/06/24 2101)  aspirin  EC tablet 81 mg  (has no administration in time range)  clopidogrel  (PLAVIX ) tablet 75 mg (has no administration in time range)  traMADol  (ULTRAM ) tablet 50 mg (has no administration in time range)  clonazePAM  (KLONOPIN ) tablet 0.5 mg (has no administration in time range)   stroke: early stages of recovery book (has no administration in time range)  acetaminophen  (TYLENOL ) tablet 650 mg (has no administration in time range)    Or  acetaminophen  (TYLENOL ) 160 MG/5ML solution 650 mg (has no administration in time range)    Or  acetaminophen  (TYLENOL ) suppository 650 mg (has no administration in time range)  enoxaparin  (LOVENOX ) injection 40 mg (has no administration in time range)  thyroid  (ARMOUR) tablet 30 mg (has no administration in time range)    And  thyroid  (ARMOUR) tablet 60 mg (has no administration in time range)  Mobility walks     Focused Assessments Neuro Assessment Handoff:  Swallow screen pass? Yes  Cardiac Rhythm: Normal sinus rhythm NIH Stroke Scale  Dizziness Present: No Headache Present: No Interval: Initial Level of Consciousness (1a.)   : Alert, keenly responsive LOC Questions (1b. )   : Answers both questions correctly LOC Commands (1c. )   : Performs both tasks correctly Best Gaze (2. )  : Normal Visual (3. )  : No visual loss Facial Palsy (4. )    : Normal symmetrical movements Motor Arm, Left (5a. )   : No drift Motor Arm, Right (5b. ) : No drift Motor Leg, Left (6a. )  : No drift Motor Leg, Right (6b. ) : No drift Limb Ataxia (7. ): Absent Sensory (8. )  : Normal, no sensory loss Best Language (9. )  : No aphasia Dysarthria (10. ): Normal Extinction/Inattention (11.)   : No Abnormality Complete NIHSS TOTAL: 0 Last date known well: 01/06/24 Last time known well: 1920 Neuro Assessment: Exceptions to WDL Neuro Checks:   Initial (01/06/24 2102)  Has TPA been given? No (offered but refused) If patient is a Neuro Trauma and patient is going to OR before floor  call report to 4N Charge nurse: [phone] or [phone]   R Recommendations: See Admitting Provider Note  Report given to:   Additional Notes:  Arrives as Code stroke with dizziness, right sided facial droop and right leg weakness that manifested ~7PM tonight. Upon arrival (-CT head) TNK offered but declined as pt said she has time left within the window. Hx of receiving TNK for similar sx that shortly resolved. NIH on arrival 6, and eventually resolved with most recent a 0. Pt alert, oriented and ambulatory. GCS - 15. No unilateral weakness.

## 2024-01-07 NOTE — Discharge Summary (Signed)
 Physician Discharge Summary   Patient: Amy Rangel MRN: [id number] DOB: 04-08-1956  Admit date:     01/06/2024  Discharge date: 01/07/24  Discharge Physician: Drue ONEIDA Potter   PCP: Epifanio Alm SQUIBB, MD   Recommendations at discharge:  Follow-up with neurologist  Discharge Diagnoses: TIA, recurrent (transient ischemic attack) Left ICA occlusion History of tPA for TIA/CVA History of hemiplegic migraine Hypothyroidism Migraine without aura and without status migrainosus, not intractable Fibromyalgia Anxiety   Hospital Course: Amy Rangel is a 68 y.o. female with medical history significant for  migraines, left ICA occlusion, hypothyroidism, fibromyalgia, anxiety, TIA/CVA around 2018 treated with tPA, TIA 2024 treated as complex migraine, being admitted for stroke workup after presenting as a code stroke.  She presented within with right-sided weakness and facial droop, onset 2000, arriving within the tPA window.  CT head per code stroke protocol negative.  She was evaluated by teleneurology who opined atypical migraine versus TIA versus stroke.   TNK was offered.  According to neurology note, she was initially agreeable but as symptoms improved she subsequently changed her mind. Patient did not get CTA head and neck due to contrast allergy. Echocardiogram showed grade 1 diastolic dysfunction.  Case was discussed with neurologist Dr. Eligio who recommended patient be discharged on aspirin  monotherapy and to follow-up with outpatient neurology.    Consultants: Neuro Procedures performed: None Disposition: Home Diet recommendation:  Cardiac diet DISCHARGE MEDICATION: Allergies as of 01/07/2024       Reactions   Bupropion Swelling   Lip swelling   Citrullus Vulgaris Other (See Comments), Rash, Swelling   Gabapentin Other (See Comments), Shortness Of Breath   Acid reflux and chest pressure   Iodinated Contrast Media Anaphylaxis   Uncoded Allergy. Allergen: ct contrast dye (with  steroids), Other Reaction: THROAT TIGHTNING   Iodine Other (See Comments)   Other reaction(s): Other (See Comments) Other Reaction: ct contrast & xray dye Other Reaction: ct contrast & xray dye   Iopamidol Other (See Comments)   Other reaction(s): Other (See Comments) Other Reaction: other reaction Other Reaction: other reaction   Levetiracetam Other (See Comments), Swelling   Facial swelling, upset stomach Swelling of face and felt drunk   Mangifera Indica Rash, Swelling   Neomycin-polymyxin-dexameth Rash, Swelling   Papaya Rash, Swelling   Prednisone Anaphylaxis   Sulfa Antibiotics Anaphylaxis, Other (See Comments)   Uncoded Allergy. Allergen: xray dye Uncoded Allergy. Allergen: ct contrast dye (with steroids), Other Reaction: THROAT TIGHTNING   Zolpidem Diarrhea   Teriparatide Other (See Comments)   Patient states that when she took this medication she had a sore throat and was very fatigue. Patient states that when she took this medication she had a sore throat and was very fatigue.   Aluminum Other (See Comments)   Growths Growths   Amoxicillin    Other reaction(s): Unknown Other reaction(s): Unknown   Atorvastatin Diarrhea   Codeine Other (See Comments)   Other reaction(s): Other (See Comments) Other Reaction: NOT ASSESSED Other Reaction: NOT ASSESSED   Diclofenac Potassium Diarrhea   Duloxetine Diarrhea   Duloxetine Hcl    Other reaction(s): Other (See Comments) constipation   Erythromycin Swelling   Swelling of face   Escitalopram Other (See Comments)   Insomnia Insomnia   Eslicarbazepine Diarrhea, Other (See Comments)   Diarrhea, insomnia   Hepatitis B Vaccine Other (See Comments)   Chest pain and arm pain   Ibuprofen Other (See Comments)   Stomach upset real bad Stomach upset real  bad   Ketoprofen Other (See Comments)   Stomach upset really bad   Lanolin Other (See Comments)   Other reaction(s): Other (See Comments) Other Reaction: NOT  ASSESSED Other Reaction: NOT ASSESSED   Metoclopramide    error   Midodrine Hcl Other (See Comments)   headache   Monosodium Glutamate    Other reaction(s): Unknown Other reaction(s): Unknown   Morphine Nausea And Vomiting   vomiting   Moxifloxacin  Other (See Comments)   Other reaction(s): Other (See Comments) Other Reaction: OTHER REACTION Other Reaction: OTHER REACTION   Moxifloxacin  Hcl    Naproxen    Naproxen Sodium Other (See Comments)   Bloody stools Bloody stool   Other    Client reports 35 allergies. Copy of allergies scanned. Vertis RN, [facility]   Oxycodone    Oxycodone-acetaminophen  Other (See Comments)   Other reaction(s): Other (See Comments) Other Reaction: NOT ASSESSED Other Reaction: NOT ASSESSED   Sulfamethoxazole-trimethoprim     Trimethoprim     Zonisamide Other (See Comments)   Bladder infection    Lamotrigine Rash   Neomycin-polymyxin B  Gu Rash   Sertraline Rash   Sertraline Hcl Rash   Valproic Acid Nausea Only        Medication List     TAKE these medications    Armour Thyroid  30 MG tablet Generic drug: thyroid  Take 30 mg by mouth daily. Take 1 tablet every day except Thursday (2 tablets 60mg )   aspirin  81 MG chewable tablet Chew 1 tablet (81 mg total) by mouth daily.   B-2-400 PO Take by mouth.   butalbital-acetaminophen -caffeine 50-325-40 MG tablet Commonly known as: FIORICET Take 1 tablet by mouth every 6 (six) hours as needed for migraine.   Cholecalciferol 50 MCG (2000 UT) Tabs Take 2,000 Units by mouth daily.   clonazePAM  0.5 MG tablet Commonly known as: KLONOPIN  Take 0.5 tablets (0.25 mg total) by mouth daily. What changed:  when to take this reasons to take this   cyclobenzaprine  5 MG tablet Commonly known as: FLEXERIL  Take 5 mg by mouth 3 (three) times daily.   GNP GARLIC EXTRACT PO Take 1 tablet by mouth daily.   Magnesium Gluconate 550 MG Tabs Take 1 tablet by mouth daily.   Omega 3  1000 MG Caps Take 1 capsule by mouth daily.   QUERCETIN COMPLEX IMMUNE PO Take 1 tablet by mouth 3 (three) times a week.   rosuvastatin  20 MG tablet Commonly known as: Crestor  Take 0.5 tablets (10 mg total) by mouth daily.   traMADol  50 MG tablet Commonly known as: ULTRAM  Take 50 mg by mouth daily as needed.   triamcinolone ointment 0.1 % Commonly known as: KENALOG Apply 1 Application topically 2 (two) times daily as needed (posion ivy).   Turmeric Curcumin 500 MG Caps Take 1 capsule by mouth daily as needed.   Zinc Sulfate 66 MG Tabs Take 1 tablet by mouth 3 (three) times a week.        Discharge Exam: Filed Weights   01/06/24 2123 01/07/24 0302  Weight: 69.7 kg 68.1 kg   Vitals and nursing note reviewed.  Constitutional:      General: She is not in acute distress. HENT:     Head: Normocephalic and atraumatic.  Cardiovascular:     Rate and Rhythm: Normal rate and regular rhythm.     Heart sounds: Normal heart sounds.  Pulmonary:     Effort: Pulmonary effort is normal.     Breath sounds: Normal breath sounds.  Abdominal:     Palpations: Abdomen is soft.     Tenderness: There is no abdominal tenderness.  Neurological:     Mental Status: Mental status is at baseline.   Condition at discharge: good  The results of significant diagnostics from this hospitalization (including imaging, microbiology, ancillary and laboratory) are listed below for reference.   Imaging Studies: ECHOCARDIOGRAM COMPLETE Result Date: 01/07/2024    ECHOCARDIOGRAM REPORT   Patient Name:   TNIA ANGLADA Date of Exam: 01/07/2024 Medical Rec #:  [id number]     Height:       62.0 in Accession #:    [id number]    Weight:       150.1 lb Date of Birth:  [birth year]     BSA:          1.692 m Patient Age:    68 years      BP:           124/53 mmHg Patient Gender: F             HR:           64 bpm. Exam Location:  [facility] Procedure: 2D Echo, Cardiac Doppler and Color Doppler (Both Spectral and Color             Flow Doppler were utilized during procedure). Indications:     Stroke I63.9  History:         Patient has prior history of Echocardiogram examinations, most                  recent 12/10/2022. Stroke and TIA.  Sonographer:     Ashley McNeely-Sloane Referring Phys:  [id number] DELAYNE LULLA SOLIAN Diagnosing Phys: Cara JONETTA Lovelace MD IMPRESSIONS  1. Left ventricular ejection fraction, by estimation, is 70 to 75%. The left ventricle has hyperdynamic function. The left ventricle has no regional wall motion abnormalities. Left ventricular diastolic parameters are consistent with Grade I diastolic dysfunction (impaired relaxation).  2. Right ventricular systolic function is normal. The right ventricular size is normal.  3. The mitral valve is normal in structure. Trivial mitral valve regurgitation.  4. The aortic valve is normal in structure. Aortic valve regurgitation is not visualized. FINDINGS  Left Ventricle: Left ventricular ejection fraction, by estimation, is 70 to 75%. The left ventricle has hyperdynamic function. The left ventricle has no regional wall motion abnormalities. Strain was performed and the global longitudinal strain is indeterminate. The left ventricular internal cavity size was normal in size. There is no left ventricular hypertrophy. Left ventricular diastolic parameters are consistent with Grade I diastolic dysfunction (impaired relaxation). Right Ventricle: The right ventricular size is normal. No increase in right ventricular wall thickness. Right ventricular systolic function is normal. Left Atrium: Left atrial size was normal in size. Right Atrium: Right atrial size was normal in size. Pericardium: There is no evidence of pericardial effusion. Mitral Valve: The mitral valve is normal in structure. Trivial mitral valve regurgitation. MV peak gradient, 3.1 mmHg. The mean mitral valve gradient is 2.0 mmHg. Tricuspid Valve: The tricuspid valve is normal in structure. Tricuspid valve regurgitation is  mild. Aortic Valve: The aortic valve is normal in structure. Aortic valve regurgitation is not visualized. Aortic valve mean gradient measures 3.0 mmHg. Aortic valve peak gradient measures 5.8 mmHg. Aortic valve area, by VTI measures 2.61 cm. Pulmonic Valve: The pulmonic valve was normal in structure. Pulmonic valve regurgitation is not visualized. Aorta: The ascending aorta was not well visualized. IAS/Shunts: No atrial level  shunt detected by color flow Doppler. Additional Comments: 3D was performed not requiring image post processing on an independent workstation and was indeterminate.  LEFT VENTRICLE PLAX 2D LVIDd:         4.30 cm     Diastology LVIDs:         2.30 cm     LV e' medial:    10.00 cm/s LV PW:         0.70 cm     LV E/e' medial:  7.3 LV IVS:        0.80 cm     LV e' lateral:   12.50 cm/s LVOT diam:     2.00 cm     LV E/e' lateral: 5.9 LV SV:         62 LV SV Index:   36 LVOT Area:     3.14 cm  LV Volumes (MOD) LV vol d, MOD A2C: 59.2 ml LV vol d, MOD A4C: 53.7 ml LV vol s, MOD A2C: 16.2 ml LV vol s, MOD A4C: 18.9 ml LV SV MOD A2C:     43.0 ml LV SV MOD A4C:     53.7 ml LV SV MOD BP:      39.1 ml RIGHT VENTRICLE             IVC RV Basal diam:  3.90 cm     IVC diam: 0.85 cm RV Mid diam:    3.15 cm RV S prime:     11.60 cm/s TAPSE (M-mode): 2.3 cm LEFT ATRIUM             Index        RIGHT ATRIUM           Index LA diam:        3.00 cm 1.77 cm/m   RA Area:     11.40 cm LA Vol (A2C):   29.4 ml 17.37 ml/m  RA Volume:   22.40 ml  13.24 ml/m LA Vol (A4C):   32.1 ml 18.97 ml/m LA Biplane Vol: 32.5 ml 19.20 ml/m  AORTIC VALVE                    PULMONIC VALVE AV Area (Vmax):    2.93 cm     PV Vmax:          0.89 m/s AV Area (Vmean):   2.71 cm     PV Vmean:         65.700 cm/s AV Area (VTI):     2.61 cm     PV VTI:           0.203 m AV Vmax:           120.00 cm/s  PV Peak grad:     3.2 mmHg AV Vmean:          82.100 cm/s  PV Mean grad:     2.0 mmHg AV VTI:            0.236 m      PR End Diast Vel:  2.70 msec AV Peak Grad:      5.8 mmHg     RVOT Peak grad:   2 mmHg AV Mean Grad:      3.0 mmHg LVOT Vmax:         112.00 cm/s LVOT Vmean:        70.700 cm/s LVOT VTI:          0.196 m LVOT/AV VTI ratio:  0.83  AORTA Ao Root diam: 3.00 cm Ao Asc diam:  3.00 cm MITRAL VALVE               TRICUSPID VALVE MV Area (PHT): 2.95 cm    TR Peak grad:   19.4 mmHg MV Area VTI:   2.34 cm    TR Vmax:        220.00 cm/s MV Peak grad:  3.1 mmHg MV Mean grad:  2.0 mmHg    SHUNTS MV Vmax:       0.88 m/s    Systemic VTI:  0.20 m MV Vmean:      59.5 cm/s   Systemic Diam: 2.00 cm MV Decel Time: 257 msec    Pulmonic VTI:  0.183 m MV E velocity: 73.40 cm/s MV A velocity: 88.00 cm/s MV E/A ratio:  0.83 Dwayne D Callwood MD Electronically signed by Cara JONETTA Lovelace MD Signature Date/Time: 01/07/2024/1:14:03 PM    Final    MR BRAIN WO CONTRAST Result Date: 01/07/2024 CLINICAL DATA:  Transient ischemic attack (TIA); Stroke, follow up EXAM: MRI HEAD WITHOUT CONTRAST MRA HEAD WITHOUT CONTRAST MRA NECK WITHOUT CONTRAST TECHNIQUE: Multiplanar, multiecho pulse sequences of the brain and surrounding structures were obtained without intravenous contrast. Angiographic images of the Circle of Willis were obtained using MRA technique without intravenous contrast. Angiographic images of the neck were obtained using MRA technique without intravenous contrast. Carotid stenosis measurements (when applicable) are obtained utilizing NASCET criteria, using the distal internal carotid diameter as the denominator. COMPARISON:  None Available. FINDINGS: MRI HEAD FINDINGS Brain: No acute infarction, hemorrhage, hydrocephalus, extra-axial collection or mass lesion. Vascular: See below. Skull and upper cervical spine: Normal marrow signal. Sinuses/Orbits: Clear sinuses.  No acute orbital findings. Other: No mastoid effusions. MRA HEAD FINDINGS Anterior circulation: No flow related signal in the left intracranial ICA with reconstitution at the opthalmic segment.  Preserved flow related signal within the visualized left MCA and ACA without significant stenosis. The right MCA and ACA are also patent without significant stenosis. Posterior circulation: Bilateral intradural vertebral arteries, basilar artery and bilateral posterior arteries are patent without proximal hemodynamically significant stenosis. MRA NECK FINDINGS Aortic arch: Great vessel origins are patent. Right carotid system: No evidence of dissection, stenosis (50% or greater), or occlusion. Left carotid system: Chronic occlusion of the left ICA at its origin. Vertebral arteries: Codominant. No evidence of dissection, stenosis (50% or greater), or occlusion. IMPRESSION: 1. No evidence of acute intracranial abnormality. 2. Chronic occlusion of the left ICA at its origin with reconstitution at the ophthalmic segment. 3. No new/interval large vessel occlusion. Electronically Signed   By: Gilmore GORMAN Molt M.D.   On: 01/07/2024 02:04   MR ANGIO HEAD WO CONTRAST Result Date: 01/07/2024 CLINICAL DATA:  Transient ischemic attack (TIA); Stroke, follow up EXAM: MRI HEAD WITHOUT CONTRAST MRA HEAD WITHOUT CONTRAST MRA NECK WITHOUT CONTRAST TECHNIQUE: Multiplanar, multiecho pulse sequences of the brain and surrounding structures were obtained without intravenous contrast. Angiographic images of the Circle of Willis were obtained using MRA technique without intravenous contrast. Angiographic images of the neck were obtained using MRA technique without intravenous contrast. Carotid stenosis measurements (when applicable) are obtained utilizing NASCET criteria, using the distal internal carotid diameter as the denominator. COMPARISON:  None Available. FINDINGS: MRI HEAD FINDINGS Brain: No acute infarction, hemorrhage, hydrocephalus, extra-axial collection or mass lesion. Vascular: See below. Skull and upper cervical spine: Normal marrow signal. Sinuses/Orbits: Clear sinuses.  No acute orbital findings. Other: No mastoid  effusions. MRA HEAD  FINDINGS Anterior circulation: No flow related signal in the left intracranial ICA with reconstitution at the opthalmic segment. Preserved flow related signal within the visualized left MCA and ACA without significant stenosis. The right MCA and ACA are also patent without significant stenosis. Posterior circulation: Bilateral intradural vertebral arteries, basilar artery and bilateral posterior arteries are patent without proximal hemodynamically significant stenosis. MRA NECK FINDINGS Aortic arch: Great vessel origins are patent. Right carotid system: No evidence of dissection, stenosis (50% or greater), or occlusion. Left carotid system: Chronic occlusion of the left ICA at its origin. Vertebral arteries: Codominant. No evidence of dissection, stenosis (50% or greater), or occlusion. IMPRESSION: 1. No evidence of acute intracranial abnormality. 2. Chronic occlusion of the left ICA at its origin with reconstitution at the ophthalmic segment. 3. No new/interval large vessel occlusion. Electronically Signed   By: Gilmore GORMAN Molt M.D.   On: 01/07/2024 02:04   MR ANGIO NECK WO CONTRAST Result Date: 01/07/2024 CLINICAL DATA:  Transient ischemic attack (TIA); Stroke, follow up EXAM: MRI HEAD WITHOUT CONTRAST MRA HEAD WITHOUT CONTRAST MRA NECK WITHOUT CONTRAST TECHNIQUE: Multiplanar, multiecho pulse sequences of the brain and surrounding structures were obtained without intravenous contrast. Angiographic images of the Circle of Willis were obtained using MRA technique without intravenous contrast. Angiographic images of the neck were obtained using MRA technique without intravenous contrast. Carotid stenosis measurements (when applicable) are obtained utilizing NASCET criteria, using the distal internal carotid diameter as the denominator. COMPARISON:  None Available. FINDINGS: MRI HEAD FINDINGS Brain: No acute infarction, hemorrhage, hydrocephalus, extra-axial collection or mass lesion. Vascular:  See below. Skull and upper cervical spine: Normal marrow signal. Sinuses/Orbits: Clear sinuses.  No acute orbital findings. Other: No mastoid effusions. MRA HEAD FINDINGS Anterior circulation: No flow related signal in the left intracranial ICA with reconstitution at the opthalmic segment. Preserved flow related signal within the visualized left MCA and ACA without significant stenosis. The right MCA and ACA are also patent without significant stenosis. Posterior circulation: Bilateral intradural vertebral arteries, basilar artery and bilateral posterior arteries are patent without proximal hemodynamically significant stenosis. MRA NECK FINDINGS Aortic arch: Great vessel origins are patent. Right carotid system: No evidence of dissection, stenosis (50% or greater), or occlusion. Left carotid system: Chronic occlusion of the left ICA at its origin. Vertebral arteries: Codominant. No evidence of dissection, stenosis (50% or greater), or occlusion. IMPRESSION: 1. No evidence of acute intracranial abnormality. 2. Chronic occlusion of the left ICA at its origin with reconstitution at the ophthalmic segment. 3. No new/interval large vessel occlusion. Electronically Signed   By: Gilmore GORMAN Molt M.D.   On: 01/07/2024 02:04   CT HEAD CODE STROKE WO CONTRAST Result Date: 01/06/2024 CLINICAL DATA:  Code stroke.  Neuro deficit, acute, stroke suspected EXAM: CT HEAD WITHOUT CONTRAST TECHNIQUE: Contiguous axial images were obtained from the base of the skull through the vertex without intravenous contrast. RADIATION DOSE REDUCTION: This exam was performed according to the departmental dose-optimization program which includes automated exposure control, adjustment of the mA and/or kV according to patient size and/or use of iterative reconstruction technique. COMPARISON:  CT head March 12, 25. FINDINGS: Brain: No evidence of acute large vascular territory infarction, hemorrhage, hydrocephalus, extra-axial collection or mass  lesion/mass effect. Vascular: No hyperdense vessel or unexpected calcification. Skull: Normal. Negative for fracture or focal lesion. Sinuses/Orbits: No acute finding. ASPECTS [facility] Stroke Program Early CT Score) Total score (0-10 with 10 being normal): 10. IMPRESSION: No evidence of acute intracranial abnormality.  ASPECTS 10. Code stroke  imaging results were communicated on 01/06/2024 at 9:07 pm to provider Ray via telephone. Electronically Signed   By: Gilmore GORMAN Molt M.D.   On: 01/06/2024 21:07    Microbiology: Results for orders placed or performed in visit on 01/29/20  Urine Culture     Status: None   Collection Time: 01/29/20  3:39 PM   Specimen: Urine  Result Value Ref Range Status   MICRO NUMBER: [id number]  Final   SPECIMEN QUALITY: Adequate  Final   Sample Source URINE  Final   STATUS: FINAL  Final   Result:   Final    Growth of mixed flora was isolated, suggesting probable contamination. No further testing will be performed. If clinically indicated, recollection using a method to minimize contamination, with prompt transfer to Urine Culture Transport Tube, is  recommended.     Labs: CBC: Recent Labs  Lab 01/06/24 2133  WBC 7.9  NEUTROABS 4.2  HGB 13.7  HCT 42.9  MCV 91.9  PLT 272   Basic Metabolic Panel: Recent Labs  Lab 01/06/24 2133  NA 139  K 3.5  CL 101  CO2 25  GLUCOSE 111*  BUN 18  CREATININE 0.77  CALCIUM  9.7   Liver Function Tests: Recent Labs  Lab 01/06/24 2133  AST 26  ALT 19  ALKPHOS 123  BILITOT 0.6  PROT 7.9  ALBUMIN 4.5   CBG: Recent Labs  Lab 01/06/24 2140  GLUCAP 97    Discharge time spent:  37 minutes.  Signed: Drue ONEIDA Potter, MD [facility] 01/07/2024

## 2024-01-07 NOTE — Progress Notes (Signed)
 NEUROLOGY CONSULT FOLLOW UP NOTE   Date of service: January 07, 2024 Patient Name: Amy Rangel MRN:  [id number] DOB:  05-Sep-1955  Interval Hx/subjective  Seen and examined.  Currently symptom-free  Vitals   Vitals:   01/07/24 0145 01/07/24 0302 01/07/24 0439 01/07/24 0750  BP: 133/67 115/72 (!) 124/53 (!) 124/54  Pulse: 67 62 67 67  Resp: 15 16 16 15   Temp:  97.7 F (36.5 C) 97.8 F (36.6 C) 97.9 F (36.6 C)  TempSrc:      SpO2: 99% 100% 98% 94%  Weight:  68.1 kg    Height:  5' 2 (1.575 m)       Body mass index is 27.46 kg/m.  Physical Exam   GENERAL: Awake, alert in NAD HEENT: - Normocephalic and atraumatic, dry mm, no LN++, no Thyromegally LUNGS - Clear to auscultation bilaterally with no wheezes CV - S1S2 RRR, no m/r/g, equal pulses bilaterally. ABDOMEN - Soft, nontender, nondistended with normoactive BS NEURO:  Mental Status: AA&Ox3 Speech and Language: speech is nondysarthric.  Naming, repetition, fluency, and comprehension intact. Cranial Nerves: PERRL. EOMI, visual fields full, no facial asymmetry, facial sensation intact, hearing intact, tongue/uvula/soft palate midline, normal sternocleidomastoid and trapezius muscle strength. No evidence of tongue atrophy or fibrillations Motor: 5/5 in all fours Tone: is normal and bulk is normal Sensation- Intact to light touch bilaterally Coordination: FTN intact bilaterally, no ataxia in BLE. Gait- deferred   Medications  Current Facility-Administered Medications:    [START ON 01/08/2024]  stroke: early stages of recovery book, , Does not apply, Once, Cleatus Delayne GAILS, MD   acetaminophen  (TYLENOL ) tablet 650 mg, 650 mg, Oral, Q4H PRN, 650 mg at 01/07/24 0324 **OR** acetaminophen  (TYLENOL ) 160 MG/5ML solution 650 mg, 650 mg, Per Tube, Q4H PRN **OR** acetaminophen  (TYLENOL ) suppository 650 mg, 650 mg, Rectal, Q4H PRN, Cleatus Delayne GAILS, MD   aspirin  EC tablet 81 mg, 81 mg, Oral, Daily, Cleatus Delayne GAILS, MD   clonazePAM   (KLONOPIN ) tablet 0.5 mg, 0.5 mg, Oral, Daily PRN, Cleatus Delayne GAILS, MD   clopidogrel  (PLAVIX ) tablet 75 mg, 75 mg, Oral, Daily, Cleatus Delayne GAILS, MD   enoxaparin  (LOVENOX ) injection 40 mg, 40 mg, Subcutaneous, Q24H, Cleatus Delayne GAILS, MD   heart attack bouncing book, , Does not apply, Once, Djan, Prince T, MD   sodium chloride  flush (NS) 0.9 % injection 3 mL, 3 mL, Intravenous, Once, Ray, Neha, MD   thyroid  (ARMOUR) tablet 30 mg, 30 mg, Oral, Once per day on Sunday Tuesday Thursday Saturday **AND** [START ON 01/08/2024] thyroid  (ARMOUR) tablet 60 mg, 60 mg, Oral, Once per day on Monday Wednesday Friday, Dail Rankin RAMAN, [place]   traMADol  (ULTRAM ) tablet 50 mg, 50 mg, Oral, Daily PRN, Cleatus Delayne GAILS, MD  Labs and Diagnostic Imaging   CBC:  Recent Labs  Lab 01/06/24 2133  WBC 7.9  NEUTROABS 4.2  HGB 13.7  HCT 42.9  MCV 91.9  PLT 272    Basic Metabolic Panel:  Lab Results  Component Value Date   NA 139 01/06/2024   K 3.5 01/06/2024   CO2 25 01/06/2024   GLUCOSE 111 (H) 01/06/2024   BUN 18 01/06/2024   CREATININE 0.77 01/06/2024   CALCIUM  9.7 01/06/2024   GFRNONAA >60 01/06/2024   GFRAA 98 06/27/2020   Lipid Panel:  Lab Results  Component Value Date   LDLCALC 128 (H) 01/07/2024   HgbA1c:  Lab Results  Component Value Date   HGBA1C 5.2 12/10/2022   Urine  Drug Screen:     Component Value Date/Time   LABOPIA NONE DETECTED 12/10/2022 0814   COCAINSCRNUR NONE DETECTED 12/10/2022 0814   LABBENZ NONE DETECTED 12/10/2022 0814   AMPHETMU NONE DETECTED 12/10/2022 0814   THCU NONE DETECTED 12/10/2022 0814   LABBARB POSITIVE (A) 12/10/2022 0814    Alcohol Level     Component Value Date/Time   [facility] <15 01/06/2024 2133   INR  Lab Results  Component Value Date   INR 1.0 01/06/2024   APTT  Lab Results  Component Value Date   APTT 28 01/06/2024   Imaging personally reviewed: MRI brain without contrast, MR angio head without contrast, MR angio neck with contrast-no acute  intracranial abnormality.  Chronic occlusion of the left ICA at its origin with reconstitution at the ophthalmic segment.  No new/interval large vessel occlusion compared to prior scans  Assessment   Amy Rangel is a 68 y.o. female past history of migraines, chronic left ICA occlusion, hypothyroidism, fibromyalgia, anxiety, multiple TIAs with tPA administration in 2018, TIA in 2024 treated as complex migraine-admitted for stroke workup after she presented as a stroke alert due to right-sided weakness and right-sided facial droop, which rapidly resolved. Imaging negative for acute process.  It is possible that chronic left ICA occlusion causing hypoperfusion caused a left hemispheric TIA but the symptom resolution was rather quick.  Given the chronic occlusion, TIA will always remain in the differential although more concern remains for this being a complicated migraine rather than true TIA   Recommendations  Aspirin  81 daily Her LDL is above goal.  She is allergic to atorvastatin, in addition to being allergic to multiple other medications-atorvastatin allergy is more of side effect with diarrhea. I would recommend trying another statin-may be rosuvastatin  to obtain goal LDL less than 70. 2D echo pending-unless it shows anything concerning, no further inpatient workup will be needed Therapy assessments Follow-up with outpatient neurology in 8 to 12 weeks  Plan relayed to Dr. Dorinda ______________________________________________________________________   Signed, Eligio Lav, MD [facility]

## 2024-01-07 NOTE — Evaluation (Signed)
 Physical Therapy Evaluation Patient Details Name: Amy Rangel MRN: [id number] DOB: January 21, 1956 Today's Date: 01/07/2024  History of Present Illness  68yoF [facility] acute onset Rt facial and RUE paresthesia, Rt facial droop, HA. MRI negative for acute CVA. Workup suggestive of complex migraine HA. PMH: hypoTSH, fibromyalgia.  Clinical Impression  Pt demonstrates baseline AMB, transfers, stairs navigation, denies any acute abnormality with these. Pt reports significant resolution of symptoms, with mild remaining HA and Rt facial paresthesia. No skilled PT intervention indicated at this time. PT signing off.       If plan is discharge home, recommend the following:     Can travel by private vehicle        Equipment Recommendations None recommended by PT  Recommendations for Other Services       Functional Status Assessment Patient has not had a recent decline in their functional status     Precautions / Restrictions Precautions Precautions: None Restrictions Weight Bearing Restrictions Per Provider Order: No      Mobility  Bed Mobility Overal bed mobility: Independent                  Transfers Overall transfer level: Independent                      Ambulation/Gait Ambulation/Gait assistance: Modified independent (Device/Increase time) Gait Distance (Feet): 500 Feet Assistive device: None Gait Pattern/deviations: WFL(Within Functional Limits) Gait velocity: 0.31m/s        Stairs Stairs: Yes Stairs assistance: Modified independent (Device/Increase time) Stair Management: Two rails Number of Stairs: 8 General stair comments: ad lib pattern; knee fracture 2019  Wheelchair Mobility     Tilt Bed    Modified Rankin (Stroke Patients Only)       Balance                                             Pertinent Vitals/Pain Pain Assessment Pain Assessment: No/denies pain    Home Living Family/patient expects to  be discharged to:: Private residence Living Arrangements: Other (Comment) (dog) Available Help at Discharge: Family;Friend(s) Type of Home: House Home Access: Stairs to enter Entrance Stairs-Rails: Right;Left;Can reach both Entrance Stairs-Number of Steps: 3   Home Layout: One level Home Equipment: None      Prior Function Prior Level of Function : Independent/Modified Independent;Driving                     Extremity/Trunk Camera operator Comments      Exercises     Assessment/Plan    PT Assessment Patient does not need any further PT services  PT Problem List         PT Treatment Interventions      PT Goals (Current goals can be found in the Care Plan section)  Acute Rehab PT Goals PT Goal Formulation: Patient unable to participate in goal setting    Frequency  Co-evaluation               AM-PAC PT 6 Clicks Mobility  Outcome Measure Help needed turning from your back to your side while in a flat bed without using bedrails?: None Help needed moving from lying on your back to sitting on the side of a flat bed without using bedrails?: None Help needed moving to and from a bed to a chair (including a wheelchair)?: None Help needed standing up from a chair using your arms (e.g., wheelchair or bedside chair)?: None Help needed to walk in hospital room?: None Help needed climbing 3-5 steps with a railing? : None 6 Click Score: 24    End of Session Equipment Utilized During Treatment: Gait belt Activity Tolerance: Patient tolerated treatment well;No increased pain     PT Visit Diagnosis: Other symptoms and signs involving the nervous system (R29.898)    Time: 9059-9049 PT Time Calculation (min) (ACUTE ONLY): 10 min   Charges:   PT Evaluation $PT Eval Low Complexity: 1 Low   PT General Charges $$ ACUTE  PT VISIT: 1 Visit    10:05 AM, 01/07/24 Amy Rangel, PT, DPT Physical Therapist - [facility]  [phone] (ASCOM)    Amy Rangel 01/07/2024, 10:03 AM

## 2024-01-08 DIAGNOSIS — M1712 Unilateral primary osteoarthritis, left knee: Secondary | ICD-10-CM | POA: Diagnosis not present

## 2024-01-16 DIAGNOSIS — F32A Depression, unspecified: Secondary | ICD-10-CM | POA: Diagnosis not present

## 2024-01-16 DIAGNOSIS — E7800 Pure hypercholesterolemia, unspecified: Secondary | ICD-10-CM | POA: Diagnosis not present

## 2024-01-16 DIAGNOSIS — I6522 Occlusion and stenosis of left carotid artery: Secondary | ICD-10-CM | POA: Diagnosis not present

## 2024-01-16 DIAGNOSIS — G459 Transient cerebral ischemic attack, unspecified: Secondary | ICD-10-CM | POA: Diagnosis not present

## 2024-01-20 DIAGNOSIS — M810 Age-related osteoporosis without current pathological fracture: Secondary | ICD-10-CM | POA: Diagnosis not present

## 2024-01-20 DIAGNOSIS — E7800 Pure hypercholesterolemia, unspecified: Secondary | ICD-10-CM | POA: Diagnosis not present

## 2024-01-20 DIAGNOSIS — I6522 Occlusion and stenosis of left carotid artery: Secondary | ICD-10-CM | POA: Diagnosis not present

## 2024-01-20 DIAGNOSIS — E890 Postprocedural hypothyroidism: Secondary | ICD-10-CM | POA: Diagnosis not present

## 2024-01-21 DIAGNOSIS — R4789 Other speech disturbances: Secondary | ICD-10-CM | POA: Diagnosis not present

## 2024-01-21 DIAGNOSIS — R519 Headache, unspecified: Secondary | ICD-10-CM | POA: Diagnosis not present

## 2024-01-21 DIAGNOSIS — R29810 Facial weakness: Secondary | ICD-10-CM | POA: Diagnosis not present

## 2024-01-21 DIAGNOSIS — G459 Transient cerebral ischemic attack, unspecified: Secondary | ICD-10-CM | POA: Diagnosis not present

## 2024-01-22 DIAGNOSIS — M546 Pain in thoracic spine: Secondary | ICD-10-CM | POA: Diagnosis not present

## 2024-01-22 DIAGNOSIS — M545 Low back pain, unspecified: Secondary | ICD-10-CM | POA: Diagnosis not present

## 2024-01-22 DIAGNOSIS — M9902 Segmental and somatic dysfunction of thoracic region: Secondary | ICD-10-CM | POA: Diagnosis not present

## 2024-01-22 DIAGNOSIS — M9901 Segmental and somatic dysfunction of cervical region: Secondary | ICD-10-CM | POA: Diagnosis not present

## 2024-01-22 DIAGNOSIS — M531 Cervicobrachial syndrome: Secondary | ICD-10-CM | POA: Diagnosis not present

## 2024-01-22 DIAGNOSIS — M9903 Segmental and somatic dysfunction of lumbar region: Secondary | ICD-10-CM | POA: Diagnosis not present

## 2024-01-27 DIAGNOSIS — E890 Postprocedural hypothyroidism: Secondary | ICD-10-CM | POA: Diagnosis not present

## 2024-01-27 DIAGNOSIS — M810 Age-related osteoporosis without current pathological fracture: Secondary | ICD-10-CM | POA: Diagnosis not present

## 2024-01-27 DIAGNOSIS — Z8781 Personal history of (healed) traumatic fracture: Secondary | ICD-10-CM | POA: Diagnosis not present

## 2024-02-05 DIAGNOSIS — S32009A Unspecified fracture of unspecified lumbar vertebra, initial encounter for closed fracture: Secondary | ICD-10-CM | POA: Diagnosis not present

## 2024-02-05 DIAGNOSIS — M797 Fibromyalgia: Secondary | ICD-10-CM | POA: Diagnosis not present

## 2024-02-05 DIAGNOSIS — M5412 Radiculopathy, cervical region: Secondary | ICD-10-CM | POA: Diagnosis not present

## 2024-02-05 DIAGNOSIS — R293 Abnormal posture: Secondary | ICD-10-CM | POA: Diagnosis not present

## 2024-02-05 DIAGNOSIS — M79605 Pain in left leg: Secondary | ICD-10-CM | POA: Diagnosis not present

## 2024-02-05 DIAGNOSIS — M546 Pain in thoracic spine: Secondary | ICD-10-CM | POA: Diagnosis not present

## 2024-02-05 DIAGNOSIS — M25562 Pain in left knee: Secondary | ICD-10-CM | POA: Diagnosis not present

## 2024-02-05 DIAGNOSIS — M542 Cervicalgia: Secondary | ICD-10-CM | POA: Diagnosis not present

## 2024-02-05 DIAGNOSIS — G43711 Chronic migraine without aura, intractable, with status migrainosus: Secondary | ICD-10-CM | POA: Diagnosis not present

## 2024-02-05 DIAGNOSIS — M79651 Pain in right thigh: Secondary | ICD-10-CM | POA: Diagnosis not present

## 2024-02-05 DIAGNOSIS — M25561 Pain in right knee: Secondary | ICD-10-CM | POA: Diagnosis not present

## 2024-02-05 DIAGNOSIS — Z79899 Other long term (current) drug therapy: Secondary | ICD-10-CM | POA: Diagnosis not present

## 2024-02-18 DIAGNOSIS — I6522 Occlusion and stenosis of left carotid artery: Secondary | ICD-10-CM | POA: Diagnosis not present

## 2024-02-18 DIAGNOSIS — I639 Cerebral infarction, unspecified: Secondary | ICD-10-CM | POA: Diagnosis not present

## 2024-02-18 DIAGNOSIS — G459 Transient cerebral ischemic attack, unspecified: Secondary | ICD-10-CM | POA: Diagnosis not present

## 2024-02-19 DIAGNOSIS — M531 Cervicobrachial syndrome: Secondary | ICD-10-CM | POA: Diagnosis not present

## 2024-02-19 DIAGNOSIS — M9902 Segmental and somatic dysfunction of thoracic region: Secondary | ICD-10-CM | POA: Diagnosis not present

## 2024-02-19 DIAGNOSIS — M546 Pain in thoracic spine: Secondary | ICD-10-CM | POA: Diagnosis not present

## 2024-02-19 DIAGNOSIS — M9903 Segmental and somatic dysfunction of lumbar region: Secondary | ICD-10-CM | POA: Diagnosis not present

## 2024-02-19 DIAGNOSIS — M9901 Segmental and somatic dysfunction of cervical region: Secondary | ICD-10-CM | POA: Diagnosis not present

## 2024-02-19 DIAGNOSIS — M545 Low back pain, unspecified: Secondary | ICD-10-CM | POA: Diagnosis not present

## 2024-02-24 DIAGNOSIS — E7800 Pure hypercholesterolemia, unspecified: Secondary | ICD-10-CM | POA: Diagnosis not present

## 2024-02-24 DIAGNOSIS — G43009 Migraine without aura, not intractable, without status migrainosus: Secondary | ICD-10-CM | POA: Diagnosis not present

## 2024-02-24 DIAGNOSIS — F419 Anxiety disorder, unspecified: Secondary | ICD-10-CM | POA: Diagnosis not present

## 2024-02-24 DIAGNOSIS — F32A Depression, unspecified: Secondary | ICD-10-CM | POA: Diagnosis not present

## 2024-02-24 DIAGNOSIS — G459 Transient cerebral ischemic attack, unspecified: Secondary | ICD-10-CM | POA: Diagnosis not present

## 2024-02-24 DIAGNOSIS — E039 Hypothyroidism, unspecified: Secondary | ICD-10-CM | POA: Diagnosis not present

## 2024-02-24 DIAGNOSIS — F411 Generalized anxiety disorder: Secondary | ICD-10-CM | POA: Diagnosis not present

## 2024-03-09 DIAGNOSIS — M9901 Segmental and somatic dysfunction of cervical region: Secondary | ICD-10-CM | POA: Diagnosis not present

## 2024-03-09 DIAGNOSIS — M546 Pain in thoracic spine: Secondary | ICD-10-CM | POA: Diagnosis not present

## 2024-03-09 DIAGNOSIS — M545 Low back pain, unspecified: Secondary | ICD-10-CM | POA: Diagnosis not present

## 2024-03-09 DIAGNOSIS — M9902 Segmental and somatic dysfunction of thoracic region: Secondary | ICD-10-CM | POA: Diagnosis not present

## 2024-03-09 DIAGNOSIS — M531 Cervicobrachial syndrome: Secondary | ICD-10-CM | POA: Diagnosis not present

## 2024-03-09 DIAGNOSIS — M9903 Segmental and somatic dysfunction of lumbar region: Secondary | ICD-10-CM | POA: Diagnosis not present

## 2024-03-24 DIAGNOSIS — I6522 Occlusion and stenosis of left carotid artery: Secondary | ICD-10-CM | POA: Diagnosis not present

## 2024-03-24 DIAGNOSIS — G459 Transient cerebral ischemic attack, unspecified: Secondary | ICD-10-CM | POA: Diagnosis not present

## 2024-04-01 DIAGNOSIS — M9902 Segmental and somatic dysfunction of thoracic region: Secondary | ICD-10-CM | POA: Diagnosis not present

## 2024-04-01 DIAGNOSIS — M9903 Segmental and somatic dysfunction of lumbar region: Secondary | ICD-10-CM | POA: Diagnosis not present

## 2024-04-01 DIAGNOSIS — M546 Pain in thoracic spine: Secondary | ICD-10-CM | POA: Diagnosis not present

## 2024-04-01 DIAGNOSIS — M9901 Segmental and somatic dysfunction of cervical region: Secondary | ICD-10-CM | POA: Diagnosis not present

## 2024-04-01 DIAGNOSIS — M545 Low back pain, unspecified: Secondary | ICD-10-CM | POA: Diagnosis not present

## 2024-04-01 DIAGNOSIS — M531 Cervicobrachial syndrome: Secondary | ICD-10-CM | POA: Diagnosis not present

## 2024-04-20 DIAGNOSIS — M545 Low back pain, unspecified: Secondary | ICD-10-CM | POA: Diagnosis not present

## 2024-04-20 DIAGNOSIS — M9902 Segmental and somatic dysfunction of thoracic region: Secondary | ICD-10-CM | POA: Diagnosis not present

## 2024-04-20 DIAGNOSIS — M546 Pain in thoracic spine: Secondary | ICD-10-CM | POA: Diagnosis not present

## 2024-04-20 DIAGNOSIS — M531 Cervicobrachial syndrome: Secondary | ICD-10-CM | POA: Diagnosis not present

## 2024-04-20 DIAGNOSIS — M9903 Segmental and somatic dysfunction of lumbar region: Secondary | ICD-10-CM | POA: Diagnosis not present

## 2024-04-20 DIAGNOSIS — M9901 Segmental and somatic dysfunction of cervical region: Secondary | ICD-10-CM | POA: Diagnosis not present

## 2024-05-06 DIAGNOSIS — S32009A Unspecified fracture of unspecified lumbar vertebra, initial encounter for closed fracture: Secondary | ICD-10-CM | POA: Diagnosis not present

## 2024-05-06 DIAGNOSIS — M546 Pain in thoracic spine: Secondary | ICD-10-CM | POA: Diagnosis not present

## 2024-05-06 DIAGNOSIS — M170 Bilateral primary osteoarthritis of knee: Secondary | ICD-10-CM | POA: Diagnosis not present

## 2024-05-06 DIAGNOSIS — M797 Fibromyalgia: Secondary | ICD-10-CM | POA: Diagnosis not present

## 2024-05-06 DIAGNOSIS — Z79899 Other long term (current) drug therapy: Secondary | ICD-10-CM | POA: Diagnosis not present

## 2024-05-06 DIAGNOSIS — M5412 Radiculopathy, cervical region: Secondary | ICD-10-CM | POA: Diagnosis not present

## 2024-05-06 DIAGNOSIS — G43711 Chronic migraine without aura, intractable, with status migrainosus: Secondary | ICD-10-CM | POA: Diagnosis not present

## 2024-05-06 DIAGNOSIS — M542 Cervicalgia: Secondary | ICD-10-CM | POA: Diagnosis not present

## 2024-05-06 DIAGNOSIS — M79605 Pain in left leg: Secondary | ICD-10-CM | POA: Diagnosis not present

## 2024-05-06 DIAGNOSIS — R293 Abnormal posture: Secondary | ICD-10-CM | POA: Diagnosis not present

## 2024-05-06 DIAGNOSIS — M79651 Pain in right thigh: Secondary | ICD-10-CM | POA: Diagnosis not present

## 2024-05-06 DIAGNOSIS — M545 Low back pain, unspecified: Secondary | ICD-10-CM | POA: Diagnosis not present
